# Patient Record
Sex: Male | Born: 1960
Health system: Southern US, Community
[De-identification: ages and names within clinical notes are randomized; demographics above are authoritative.]

## PROBLEM LIST (undated history)

## (undated) DIAGNOSIS — E119 Type 2 diabetes mellitus without complications: Secondary | ICD-10-CM

## (undated) DIAGNOSIS — F419 Anxiety disorder, unspecified: Secondary | ICD-10-CM

## (undated) DIAGNOSIS — F32A Depression, unspecified: Secondary | ICD-10-CM

## (undated) DIAGNOSIS — C642 Malignant neoplasm of left kidney, except renal pelvis: Secondary | ICD-10-CM

## (undated) DIAGNOSIS — Z95810 Presence of automatic (implantable) cardiac defibrillator: Secondary | ICD-10-CM

## (undated) DIAGNOSIS — I509 Heart failure, unspecified: Secondary | ICD-10-CM

## (undated) DIAGNOSIS — I10 Essential (primary) hypertension: Secondary | ICD-10-CM

## (undated) DIAGNOSIS — E785 Hyperlipidemia, unspecified: Secondary | ICD-10-CM

## (undated) DIAGNOSIS — G4730 Sleep apnea, unspecified: Secondary | ICD-10-CM

## (undated) DIAGNOSIS — F329 Major depressive disorder, single episode, unspecified: Secondary | ICD-10-CM

## (undated) HISTORY — DX: Malignant neoplasm of left kidney, except renal pelvis: C64.2

## (undated) HISTORY — DX: Essential (primary) hypertension: I10

## (undated) HISTORY — DX: Depression, unspecified: F32.A

## (undated) HISTORY — DX: Type 2 diabetes mellitus without complications: E11.9

## (undated) HISTORY — PX: IMPLANTABLE CARDIOVERTER DEFIBRILLATOR IMPLANT: SHX5860

## (undated) HISTORY — DX: Hyperlipidemia, unspecified: E78.5

## (undated) HISTORY — PX: NEPHRECTOMY: SHX65

## (undated) HISTORY — DX: Major depressive disorder, single episode, unspecified: F32.9

## (undated) HISTORY — DX: Anxiety disorder, unspecified: F41.9

## (undated) HISTORY — DX: Heart failure, unspecified: I50.9

---

## 2009-03-13 ENCOUNTER — Inpatient Hospital Stay (HOSPITAL_COMMUNITY): Admission: EM | Admit: 2009-03-13 | Discharge: 2009-03-16 | Payer: Self-pay | Admitting: Emergency Medicine

## 2009-03-14 ENCOUNTER — Encounter (INDEPENDENT_AMBULATORY_CARE_PROVIDER_SITE_OTHER): Payer: Self-pay | Admitting: Internal Medicine

## 2010-10-11 ENCOUNTER — Ambulatory Visit
Admission: RE | Admit: 2010-10-11 | Discharge: 2010-10-11 | Disposition: A | Discharge status: Home / Self Care | Payer: BC Managed Care – PPO | Source: Ambulatory Visit | Attending: Endocrinology | Admitting: Endocrinology

## 2010-10-11 ENCOUNTER — Other Ambulatory Visit: Payer: Self-pay | Admitting: Endocrinology

## 2010-10-11 DIAGNOSIS — M545 Low back pain, unspecified: Secondary | ICD-10-CM

## 2010-11-16 LAB — GLUCOSE, CAPILLARY
Glucose-Capillary: 182 mg/dL — ABNORMAL HIGH (ref 70–99)
Glucose-Capillary: 186 mg/dL — ABNORMAL HIGH (ref 70–99)
Glucose-Capillary: 197 mg/dL — ABNORMAL HIGH (ref 70–99)
Glucose-Capillary: 202 mg/dL — ABNORMAL HIGH (ref 70–99)
Glucose-Capillary: 203 mg/dL — ABNORMAL HIGH (ref 70–99)
Glucose-Capillary: 203 mg/dL — ABNORMAL HIGH (ref 70–99)
Glucose-Capillary: 220 mg/dL — ABNORMAL HIGH (ref 70–99)
Glucose-Capillary: 220 mg/dL — ABNORMAL HIGH (ref 70–99)
Glucose-Capillary: 232 mg/dL — ABNORMAL HIGH (ref 70–99)
Glucose-Capillary: 246 mg/dL — ABNORMAL HIGH (ref 70–99)

## 2010-11-16 LAB — CBC
HCT: 41.4 % (ref 39.0–52.0)
HCT: 45.6 % (ref 39.0–52.0)
Hemoglobin: 15.6 g/dL (ref 13.0–17.0)
MCHC: 34.2 g/dL (ref 30.0–36.0)
MCHC: 34.6 g/dL (ref 30.0–36.0)
MCV: 92.1 fL (ref 78.0–100.0)
MCV: 92.5 fL (ref 78.0–100.0)
Platelets: 180 10*3/uL (ref 150–400)
Platelets: 190 10*3/uL (ref 150–400)
Platelets: 198 10*3/uL (ref 150–400)
RBC: 4.51 MIL/uL (ref 4.22–5.81)
RBC: 4.65 MIL/uL (ref 4.22–5.81)
RBC: 4.93 MIL/uL (ref 4.22–5.81)
RDW: 12.8 % (ref 11.5–15.5)
RDW: 13.3 % (ref 11.5–15.5)
WBC: 6.8 10*3/uL (ref 4.0–10.5)
WBC: 6.9 10*3/uL (ref 4.0–10.5)
WBC: 7.4 10*3/uL (ref 4.0–10.5)

## 2010-11-16 LAB — POCT I-STAT 3, ART BLOOD GAS (G3+)
Acid-Base Excess: 1 mmol/L (ref 0.0–2.0)
O2 Saturation: 96 %
TCO2: 27 mmol/L (ref 0–100)
pCO2 arterial: 39.6 mmHg (ref 35.0–45.0)
pO2, Arterial: 80 mmHg (ref 80.0–100.0)

## 2010-11-16 LAB — BASIC METABOLIC PANEL
BUN: 12 mg/dL (ref 6–23)
BUN: 13 mg/dL (ref 6–23)
Calcium: 9.1 mg/dL (ref 8.4–10.5)
Chloride: 107 mEq/L (ref 96–112)
Creatinine, Ser: 0.92 mg/dL (ref 0.4–1.5)
Creatinine, Ser: 0.99 mg/dL (ref 0.4–1.5)
GFR calc Af Amer: >60 mL/min (ref >60)
GFR calc Af Amer: >60 mL/min (ref >60)
GFR calc non Af Amer: >60 mL/min (ref >60)
Glucose, Bld: 163 mg/dL — ABNORMAL HIGH (ref 70–99)
Potassium: 3.2 mEq/L — ABNORMAL LOW (ref 3.5–5.1)
Potassium: 3.7 mEq/L (ref 3.5–5.1)
Sodium: 140 mEq/L (ref 135–145)

## 2010-11-16 LAB — COMPREHENSIVE METABOLIC PANEL
ALT: 26 U/L (ref 0–53)
AST: 26 U/L (ref 0–37)
Calcium: 8.8 mg/dL (ref 8.4–10.5)
Creatinine, Ser: 0.88 mg/dL (ref 0.4–1.5)
GFR calc Af Amer: >60 mL/min (ref >60)
Sodium: 139 mEq/L (ref 135–145)
Total Protein: 6.3 g/dL (ref 6.0–8.3)

## 2010-11-16 LAB — DIFFERENTIAL
Basophils Absolute: 0.1 K/uL (ref 0.0–0.1)
Basophils Relative: 1 % (ref 0–1)
Eosinophils Absolute: 0.1 10*3/uL (ref 0.0–0.7)
Eosinophils Relative: 2 % (ref 0–5)
Lymphocytes Relative: 23 % (ref 12–46)
Lymphs Abs: 1.7 10*3/uL (ref 0.7–4.0)
Monocytes Absolute: 0.6 10*3/uL (ref 0.1–1.0)
Monocytes Relative: 8 % (ref 3–12)
Neutro Abs: 5 K/uL (ref 1.7–7.7)
Neutrophils Relative %: 67 % (ref 43–77)

## 2010-11-16 LAB — BASIC METABOLIC PANEL WITH GFR
CO2: 21 meq/L (ref 19–32)
Calcium: 9.1 mg/dL (ref 8.4–10.5)
Creatinine, Ser: 0.73 mg/dL (ref 0.4–1.5)
GFR calc Af Amer: >60 mL/min (ref >60)
GFR calc non Af Amer: >60 mL/min (ref >60)
Sodium: 138 meq/L (ref 135–145)

## 2010-11-16 LAB — POCT CARDIAC MARKERS
CKMB, poc: <1 ng/mL — ABNORMAL LOW (ref 1.0–8.0)
Myoglobin, poc: 60.5 ng/mL (ref 12–200)
Myoglobin, poc: 64.2 ng/mL (ref 12–200)
Troponin i, poc: <0.05 ng/mL (ref 0.00–0.09)

## 2010-11-16 LAB — POCT I-STAT 3, VENOUS BLOOD GAS (G3P V)
Bicarbonate: 26 mEq/L — ABNORMAL HIGH (ref 20.0–24.0)
O2 Saturation: 61 %
pCO2, Ven: 46.5 mmHg (ref 45.0–50.0)
pO2, Ven: 34 mmHg (ref 30.0–45.0)

## 2010-11-16 LAB — CARDIAC PANEL(CRET KIN+CKTOT+MB+TROPI)
Relative Index: INVALID (ref 0.0–2.5)
Relative Index: INVALID (ref 0.0–2.5)
Total CK: 72 U/L (ref 7–232)
Total CK: 86 U/L (ref 7–232)
Troponin I: 0.04 ng/mL (ref 0.00–0.06)

## 2010-11-16 LAB — HEPARIN LEVEL (UNFRACTIONATED): Heparin Unfractionated: 0.19 IU/mL — ABNORMAL LOW (ref 0.30–0.70)

## 2010-11-16 LAB — TROPONIN I: Troponin I: 0.05 ng/mL (ref 0.00–0.06)

## 2010-11-16 LAB — CK TOTAL AND CKMB (NOT AT ARMC): Relative Index: INVALID (ref 0.0–2.5)

## 2010-11-16 LAB — PROTIME-INR
INR: 1 (ref 0.00–1.49)
Prothrombin Time: 13.2 seconds (ref 11.6–15.2)

## 2010-11-16 LAB — APTT: aPTT: 25 s (ref 24–37)

## 2010-11-29 ENCOUNTER — Other Ambulatory Visit: Payer: Self-pay | Admitting: Urology

## 2010-11-29 ENCOUNTER — Ambulatory Visit (HOSPITAL_BASED_OUTPATIENT_CLINIC_OR_DEPARTMENT_OTHER)
Admission: RE | Admit: 2010-11-29 | Discharge: 2010-11-29 | Disposition: A | Discharge status: Home / Self Care | Payer: BC Managed Care – PPO | Source: Ambulatory Visit | Attending: Urology | Admitting: Urology

## 2010-11-29 DIAGNOSIS — Z01810 Encounter for preprocedural cardiovascular examination: Secondary | ICD-10-CM | POA: Insufficient documentation

## 2010-11-29 DIAGNOSIS — Z7982 Long term (current) use of aspirin: Secondary | ICD-10-CM | POA: Insufficient documentation

## 2010-11-29 DIAGNOSIS — Z794 Long term (current) use of insulin: Secondary | ICD-10-CM | POA: Insufficient documentation

## 2010-11-29 DIAGNOSIS — R310 Gross hematuria: Secondary | ICD-10-CM | POA: Insufficient documentation

## 2010-11-29 DIAGNOSIS — Z01812 Encounter for preprocedural laboratory examination: Secondary | ICD-10-CM | POA: Insufficient documentation

## 2010-11-29 DIAGNOSIS — N329 Bladder disorder, unspecified: Secondary | ICD-10-CM | POA: Insufficient documentation

## 2010-11-29 DIAGNOSIS — Z79899 Other long term (current) drug therapy: Secondary | ICD-10-CM | POA: Insufficient documentation

## 2010-11-29 DIAGNOSIS — R9431 Abnormal electrocardiogram [ECG] [EKG]: Secondary | ICD-10-CM | POA: Insufficient documentation

## 2010-11-29 LAB — POCT I-STAT 4, (NA,K, GLUC, HGB,HCT)
Potassium: 4.5 mEq/L (ref 3.5–5.1)
Sodium: 139 mEq/L (ref 135–145)

## 2010-12-04 ENCOUNTER — Inpatient Hospital Stay (HOSPITAL_COMMUNITY): Payer: BC Managed Care – PPO

## 2010-12-04 ENCOUNTER — Inpatient Hospital Stay (HOSPITAL_COMMUNITY)
Admission: EM | Admit: 2010-12-04 | Discharge: 2010-12-07 | DRG: 326 | Disposition: A | Discharge status: Home / Self Care | Payer: BC Managed Care – PPO | Attending: Urology | Admitting: Urology

## 2010-12-04 DIAGNOSIS — R310 Gross hematuria: Principal | ICD-10-CM | POA: Diagnosis present

## 2010-12-04 DIAGNOSIS — I10 Essential (primary) hypertension: Secondary | ICD-10-CM | POA: Diagnosis present

## 2010-12-04 DIAGNOSIS — Z794 Long term (current) use of insulin: Secondary | ICD-10-CM

## 2010-12-04 DIAGNOSIS — Z9889 Other specified postprocedural states: Secondary | ICD-10-CM

## 2010-12-04 DIAGNOSIS — E119 Type 2 diabetes mellitus without complications: Secondary | ICD-10-CM | POA: Diagnosis present

## 2010-12-04 DIAGNOSIS — R109 Unspecified abdominal pain: Secondary | ICD-10-CM | POA: Diagnosis present

## 2010-12-04 DIAGNOSIS — E7800 Pure hypercholesterolemia, unspecified: Secondary | ICD-10-CM | POA: Diagnosis present

## 2010-12-04 LAB — GLUCOSE, CAPILLARY
Glucose-Capillary: 185 mg/dL — ABNORMAL HIGH (ref 70–99)
Glucose-Capillary: 197 mg/dL — ABNORMAL HIGH (ref 70–99)

## 2010-12-04 MED ORDER — GADOBENATE DIMEGLUMINE 529 MG/ML IV SOLN
20.0000 mL | Freq: Once | INTRAVENOUS | Status: AC | PRN
  Administered 2010-12-04: 20 mL via INTRAVENOUS

## 2010-12-05 LAB — GLUCOSE, CAPILLARY
Glucose-Capillary: 206 mg/dL — ABNORMAL HIGH (ref 70–99)
Glucose-Capillary: 173 mg/dL — ABNORMAL HIGH (ref 70–99)
Glucose-Capillary: 198 mg/dL — ABNORMAL HIGH (ref 70–99)
Glucose-Capillary: 190 mg/dL — ABNORMAL HIGH (ref 70–99)

## 2010-12-06 LAB — GLUCOSE, CAPILLARY
Glucose-Capillary: 196 mg/dL — ABNORMAL HIGH (ref 70–99)
Glucose-Capillary: 193 mg/dL — ABNORMAL HIGH (ref 70–99)

## 2010-12-06 LAB — BASIC METABOLIC PANEL
BUN: 15 mg/dL (ref 6–23)
CO2: 27 mEq/L (ref 19–32)
Chloride: 100 mEq/L (ref 96–112)
Creatinine, Ser: 1.5 mg/dL (ref 0.4–1.5)

## 2010-12-06 LAB — MRSA PCR SCREENING: MRSA by PCR: NEGATIVE

## 2010-12-06 LAB — HEMOGLOBIN A1C: Hgb A1c MFr Bld: 7.7 % — ABNORMAL HIGH (ref <5.7)

## 2010-12-06 LAB — CBC
MCH: 29.8 pg (ref 26.0–34.0)
MCV: 88.6 fL (ref 78.0–100.0)
Platelets: 218 10*3/uL (ref 150–400)
RBC: 4.13 MIL/uL — ABNORMAL LOW (ref 4.22–5.81)

## 2010-12-07 NOTE — Discharge Summary (Signed)
Donald Moore, Donald Moore              ACCOUNT NO.:  [id number]  MEDICAL RECORD NO.:  [id number]           PATIENT TYPE:  I  LOCATION:  1442                         FACILITY:  [facility]  PHYSICIAN:  Danae Chen, M.D.  DATE OF BIRTH:  1961-04-16  DATE OF ADMISSION:  12/04/2010 DATE OF DISCHARGE:  12/07/2010                              DISCHARGE SUMMARY   DISCHARGE DIAGNOSES: 1. Gross hematuria. 2. Diabetes.  PROCEDURES:  Cystoscopy, left retrograde pyelogram and insertion of double-J stent on December 05, 2010.  HISTORY:  The patient is a 50 year old male who started having severe left flank pain on December 03, 2010 associated with nausea and vomiting. He has been having gross hematuria on and off for about 6 to 7 weeks.  A CT scan without contrast showed unremarkable kidneys.  He was then referred to me for further evaluation.  He had a cystoscopy and biopsy of a bladder lesion under anesthesia on November 29, 2010.  Biopsy of the lesion is benign.  He went to the emergency room at [facility] with severe left flank pain, nausea, vomiting and gross hematuria. A Foley catheter was then inserted in the bladder.  A CT without contrast showed probable hemorrhage in the left kidney and very small amount of air outside of the bladder on the right side that is probably secondary to his bladder biopsy.  There was no evidence of fluid collection in the pelvis.  He was then transferred to [facility] for further evaluation and treatment.  PHYSICAL EXAMINATION:  HEENT:  His head was normal. CHEST:  Symmetrical. LUNGS: Clear. HEART: Regular rhythm. ABDOMEN:  Protuberant, tender in the left flank.  He had severe left CVA tenderness.  Kidneys are not palpable.  There was no tenderness in the suprapubic area.  Bowel sounds were normal.  LABORATORY DATA:  His creatinine was 1.13.  Glucose was elevated at 252. CBC was normal.  HOSPITAL COURSE:  An MRI of the kidneys showed normal right kidney  and no solid tumor of the left kidney.  A urothelial tumor could not be ruled out because of blood clots in the collecting system.  The patient underwent cystoscopy and attempted ureteroscopy of the left renal unit. However, the ureteroscope could not be advanced through the ureter.  A retrograde pyelogram showed no evidence of filling defect in the ureter nor in the renal pelvis or the calyces.  Since the ureteroscopy could not be done, a double-J stent was left in place.  Since double-J stent insertion, the patient has been doing much better.  He has less pain. The Foley catheter was draining slightly bloody urine and the urine gradually cleared up.  Foley catheter was removed this morning and he has been voiding on his own.  Urine is dark colored.  There was no evidence of gross bleeding at this time.  He does not have any more pain.  He is tolerating his diet well.  He does not have any tenderness in the flank nor in the suprapubic area.  His hemoglobin A1c was 7.7. He was then discharged home on April 28 on all his home medications  and Macrodantin 50 mg daily h.s. and Vicodin 5/325 one tablet every 4 hours as needed for pain.  The patient will be followed as an outpatient.  He will have repeat ureteroscopy when the urine is completely clear.  Hopefully with the stent in place, will have passive dilation of the ureter and will be easier to pass the ureteroscope up into the renal pelvis.  CONDITION ON DISCHARGE:  Improved.  DISCHARGE DIET:  1800-calorie diabetic diet.  ACTIVITY:  The patient is instructed not to do any lifting, straining until further advised.     Danae Chen, M.D.     MN/MEDQ  D:  12/07/2010  T:  12/07/2010  Job:  [id number]  cc:   Brooke Bonito, M.D. Fax: [phone]  Electronically Signed by Lindaann Slough M.D. on 12/07/2010 11:05:41 AM

## 2010-12-07 NOTE — Op Note (Signed)
Donald Moore, Donald Moore              ACCOUNT NO.:  [id number]  MEDICAL RECORD NO.:  [id number]           PATIENT TYPE:  I  LOCATION:  1442                         FACILITY:  [facility]  PHYSICIAN:  Danae Chen, M.D.  DATE OF BIRTH:  02-05-1961  DATE OF PROCEDURE:  12/05/2010 DATE OF DISCHARGE:                              OPERATIVE REPORT   PREOPERATIVE DIAGNOSES:  Gross hematuria, left flank pain.  POSTOPERATIVE DIAGNOSIS:  Gross hematuria, left flank pain.  PROCEDURES: 1. Cystoscopy. 2. Left retrograde pyelogram. 3. Attempted left ureteroscopy. 4. Insertion of double-J stent.  SURGEON:  Danae Chen, M.D.  ANESTHESIA:  General.  INDICATIONS:  The patient is a 50 year old male who has a history of gross hematuria on and off for the past 6-7 weeks.  Cystoscopy on November 29, 2010, showed a small lesion on the right side of the bladder.  A biopsy of the bladder lesion was done.  The patient was doing well until Tuesday night when he had sudden onset of severe left flank pain associated with gross hematuria.  CT scan showed possible  hemorrhage in the left kidney with several blood clots in the left collecting system. He also had a small amount of air on the right side of the bladder that is probably secondary to his bladder biopsy.  He continued to have flank pain.  A Foley catheter was inserted in the bladder and it drained grossly bloody urine at first and then urine cleared up.  MRI of the kidneys showed no solid tumor of the kidney and blood clots in the renal collecting system that could  mask a urothelial tumor.  Since the patient had continued to complain of pain, we discussed cystoscopy with retrograde pyelogram and ureteroscopy.  I explained to him and his wife that the because of the bleeding, it may be difficult to see the mucosa of the renal pelvis and collecting systems and if that is not possible we would then put a stent in the left kidney to see if that would relieve  the pain and if there is not good visualization of the renal collecting system, he would then need repeat ureteroscopy when the bleeding stops.  The risks and benefits of the procedure were discussed in detail.  The risks include but are not limited to bleeding, infection, ureteral injury and inability to visualize properly the renal collecting system.  They understand and are agreeable and they gave informed consent.  The patient was identified by his wristband and proper time-out was taken.  DESCRIPTION OF PROCEDURE:  Under general anesthesia, he was prepped and draped and placed in the dorsal lithotomy position.  A panendoscope was inserted in the bladder.  The area of biopsy of the right lateral wall of the bladder above the ureteral orifice showed no evidence of hemorrhage.  The right ureteral orifice was normal.  There was bloody efflux from the left ureteral orifice.  There was no stone or tumor in the bladder.  Retrograde pyelogram.  A cone-tip catheter was passed through the cystoscope and through the left ureteral orifice.  Contrast was then injected through the cone-tip catheter.  The ureter appeared normal. The renal pelvis is intrarenal and is not dilated.  There is no evidence of filling defect in the collecting system.  The cone-tip catheter was then removed.  A sensor wire was passed through the cystoscope and through the left ureter.  The cystoscope was removed.  A ureteroscope access sheath for the digital ureteroscope was passed over the guidewire but could not be advanced beyond the distal ureter.  There appeared to be some obstruction to the passage of the ureteroscope access sheath. The ureteroscope access sheath was removed.  The inner sheath of a ureteroscope access sheath was passed over the guidewire but could not be passed beyond the distal ureter.  The ureteroscope access sheath was removed.  Then the ureteroscope access sheath for the digital ureteroscope  was then passed over the wire into the distal ureter.  The flexible ureteroscope was then passed over the sensor wire.  There was hemorrhage in the ureter making difficult to visualize the lumen of the ureter and the ureteroscope could not be advanced over the sensor wire. At this point, I decided to remove the ureteroscope and the ureteroscope access sheath and to place a double-J stent over the guide wire.  Then the guidewire was backloaded into the cystoscope and a #6-French - 26 double-J stent was passed over the guidewire.  The proximal curl of the double-J stent is in the collecting system.  The distal curl is in the bladder.  The guidewire was then removed.  The bladder was irrigated with normal saline and the return was then pinkish.  The cystoscope was then removed.  A #16-French Foley catheter was then reinserted in the bladder.  The patient tolerated the procedure well and left the OR in satisfactory condition to postanesthesia care unit.     Danae Chen, M.D.     MN/MEDQ  D:  12/05/2010  T:  12/06/2010  Job:  [id number]  cc:   Brooke Bonito, M.D. Fax: [phone]  Electronically Signed by Lindaann Slough M.D. on 12/07/2010 11:03:36 AM

## 2010-12-07 NOTE — Op Note (Signed)
  [name], Donald Moore              ACCOUNT NO.:  [id number]  MEDICAL RECORD NO.:  [id number]            PATIENT TYPE:  LOCATION:                                 FACILITY:  PHYSICIAN:  Danae Chen, M.D.       DATE OF BIRTH:  DATE OF PROCEDURE:  11/29/2010 DATE OF DISCHARGE:                              OPERATIVE REPORT   PREOPERATIVE DIAGNOSIS:  Gross hematuria.  POSTOPERATIVE DIAGNOSIS:  Gross hematuria, plus papillary lesion of the bladder above the right ureteral orifice.  PROCEDURES DONE:  Cystoscopy and biopsy of bladder lesion.  SURGEON:  Danae Chen, M.D.  ANESTHESIA:  General.  INDICATIONS:  The patient is a 50 year old male who has a history of gross painless hematuria for 6 to 7 weeks.  CT scan showed normal urinary tracts.  The patient was advised to have a cystoscopy to complete the evaluation.  However, he preferred to have it done under general anesthesia.  He is scheduled today for the procedure.  The patient was identified by his wristband and proper time-out was taken.  DESCRIPTION OF PROCEDURE:  Under general anesthesia, he was prepped and draped and placed in the dorsal lithotomy position.  A panendoscope was inserted in the bladder.  The anterior urethra was normal.  He had moderate prostatic hypertrophy.  There was a small papillary lesion on the right side of the bladder above the right ureteral orifice.   It did notappear to be transitional cell carcinoma.  There was no other lesion in the bladder.  There was no stone.  The ureteral orifices were in normal position and shape with clear efflux.  A cold cup biopsy of the papillary lesion was done.  The area of biopsy was then fulgurated with the Bugbee electrode.  The bladder was examined with both the right-angle and Foroblique lenses.  There was no evidence of bleeding at the end of the procedure.  The bladder was then irrigated with normal saline and washings were sent for cytology.  The bladder  was then emptied and the cystoscope was removed.  The patient tolerated the procedure well and left the OR in satisfactory condition to postanesthesia care unit.     Danae Chen, M.D.     MN/MEDQ  D:  11/29/2010  T:  11/29/2010  Job:  [id number]  Electronically Signed by Lindaann Slough M.D. on 12/07/2010 11:00:45 AM

## 2010-12-10 NOTE — H&P (Signed)
Donald Moore, Donald Moore              ACCOUNT NO.:  [id number]  MEDICAL RECORD NO.:  [id number]           PATIENT TYPE:  E  LOCATION:  WLED                         FACILITY:  [facility]  PHYSICIAN:  Bertram Millard. Deardra Hinkley, M.D.DATE OF BIRTH:  [birth year]  DATE OF ADMISSION:  12/04/2010 DATE OF DISCHARGE:                             HISTORY & PHYSICAL   REASON FOR ADMISSION:  Gross hematuria, flank pain.  BRIEF HISTORY:  This 50 year old male began having significant left flank pain about 11 o'clock p.m. on December 03, 2010.  This was associated with nausea and vomiting.  It was noted to be a "10 out of 10."  The patient has had intermittent gross hematuria for some time, for 6 to 7 weeks.  He was initially seen by Dr. Adela Lank and underwent a noncontrasted CT which revealed normal findings.  He was subsequently referred to Dr. Su Grand for further evaluation.  This included an NMP22 which was negative from November 26, 2010.  He underwent cystoscopy under anesthesia on November 29, 2010.  This revealed a small lesion around the patient's right ureteral orifice which was biopsied.  No other abnormalities were seen.  The patient subsequently went home after this.  Pathology biopsy was negative.  He presented to the emergency room at [facility] early this morning with severe flank pain, nausea, and vomiting.  He was found to have hematuria.  Catheter was placed.  CT urogram was performed revealing probable hemorrhage in the left kidney and a left renal collecting system and a very small amount of air outside the bladder on the right side.  He had a catheter placed with blood was produced.  He was subsequently transferred to [facility] for further management.  PAST MEDICAL HISTORY:  His past medical history is significant for cardiomyopathy, most likely viral induced.  This has been followed at [facility].  Currently, the patient apparently has an ejection  fraction of about 30%.  He has a history of sleep apnea, hypercholesterolemia, hypertension, and diabetes.  CURRENT MEDICATIONS:  Include aspirin which has been on hold, Lantus insulin 5 units subcu after dinner, Crestor 10 mg in the evening, ramipril 5 mg b.i.d., metformin 500 mg b.i.d., citalopram 20 mg daily, carvedilol 6.25 mg 1/1/2 tabs q.12 h.  ALLERGIES:  He denies drug allergies.  The patient is married.  He quit cigarette smoking 16 years ago.  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  FAMILY HISTORY:  Significant for TIA, hypertension, heart disease.  REVIEW OF SYSTEMS:  Negative across all 12 systems except for nausea, vomiting, left flank pain and gross hematuria.  PHYSICAL EXAMINATION:  GENERAL:  Exam revealed a pleasant but uncomfortable middle-aged male. HEENT:  Normal. NECK:  Supple without thyromegaly or adenopathy. CHEST:  Reveal clear breath sounds bilaterally. HEART:  Normal rate and rhythm. ABDOMEN:  Protuberant, soft, nondistended with moderate left lower quadrant and left CVA tenderness.  Bowel sounds are present.  Genitalia were normal.  He had trace pretibial edema bilaterally.  Dorsalis pedis and posterior tibial pulses were normal bilaterally. SKIN:  Warm and dry, turgor was normal. NEUROLOGIC EXAM:  Grossly intact.  RADIOLOGIC DATA/LABORATORY DATA:  I was unable to view the images of a CT, but the patient was noted to have the previously mentioned findings.  Admission data included a CBC that was normal.  Basic metabolic panel was normal except for glucose of 252.  Creatinine was 1.13.  Calcium was normal.  Urinalysis revealed too numerous to count red cells, 10 to 20 white cells, moderate mucus and been very few bacteria.  IMPRESSION: 1. Gross hematuria.  This is likely secondary to a left renal lesion     within the collecting system, such as urothelial carcinoma or less     likely a vascular malformation.  Currently, the patient is having     significant  pain, although the urine coming out of the catheter at     this point is clear. 2. Status post recent bladder biopsy.  He has a very small amount of     air outside his bladder on the CT scan, I think this is probably     inconsequential  PLAN: 1. I will admit the patient to Dr. Madilyn Hook service. 2. I will put him on a PCA and he will be hydrated.  Glucoses will be     followed carefully. 3. Likely, in the near future, he will require anesthetic cystoscopy,     left ureteroscopy.     Bertram Millard. Rhylei Mcquaig, M.D.     SMD/MEDQ  D:  12/04/2010  T:  12/04/2010  Job:  [id number]  cc:   Lindaann Slough, M.D. Fax: [phone]  Brooke Bonito, M.D. Fax: [phone]  Electronically Signed by Donald Moore M.D. on 12/10/2010 12:02:55 PM

## 2010-12-24 NOTE — Discharge Summary (Signed)
Donald Moore, Donald Moore              ACCOUNT NO.:  [id number]   MEDICAL RECORD NO.:  [id number]          PATIENT TYPE:  INP   LOCATION:  3715                         FACILITY:  [facility]   PHYSICIAN:  Theodosia Paling, MD    DATE OF BIRTH:  Jun 12, 1961   DATE OF ADMISSION:  03/13/2009  DATE OF DISCHARGE:  03/16/2009                               DISCHARGE SUMMARY   PRIMARY CARE PHYSICIAN:  Brooke Bonito, MD   ADMITTING HISTORY:  Please refer to my admission note dictated on March 13, 2009, under history of present illness.   DISCHARGE DIAGNOSES:  1. Systolic congestive heart failure, newly diagnosed in this      admission with ejection fraction of 20% with global hypokinesia      compensated at the time of discharge.  2. History of diabetes.  3. Hypertension.  4. Hyperlipidemia.   DISCHARGE MEDICATIONS:  New medications added:  1. Coreg 6.25 mg p.o. q.12 h.  2. Lantus 20 units subcu at bedtime.  3. Aldactone 25 mg p.o. daily.   Home medications to continue:  1. Altace 5 mg p.o. daily.  2. Aspirin 81 mg enteric-coated p.o. daily.  3. Crestor 10 mg p.o. at bedtime.  4. Byetta 10 units subcu at bedtime.  5. Medication on hold for a week and then can be resumed   HOSPITAL COURSE:  Following issues were addressed during the  hospitalization.  1. Chest pain.  The patient was admitted to the hospital for chest      pain.  He had some EKG changes and significant coronary artery      disease risk, therefore, cardiac cath was performed by Dr. Algie Coffer.      Cardiology consult was done by him on March 13, 2009.  Cardiac cath      was negative for coronary artery disease.  However, the patient had      global hypokinesia with EF of 20%.  Cardioprotective medications      were continued.  I added Aldactone and Coreg to his regimen.  The      patient is compensated at the time of discharge.  He will be      following up with Dr. Algie Coffer.  He was hemodynamically stable and      tolerated newly  added medications well.  2.  Hypertension.  Well      controlled on home medication.  He tolerated Coreg and Aldactone      addition as well.  2. Diabetes.  The patient's blood sugar is still high with 8 units of      Lantus.  We are increasing it to 12 units of Lantus.  He will      resume his metformin after 1 week and follow up with the primary      care physician for further diabetes management.   CONSULTATION PERFORMED AND PROCEDURE PERFORMED:  As mentioned under  hospital course.   IMAGING PERFORMED:  Chest x-ray on March 13, 2009, showing mild volume  loss and atelectasis in the right lung base.  No airspace disease.   DISPOSITION:  1. The patient will follow up with Dr. Algie Coffer in his office for      cardiac followup.  2. The patient will follow up with Dr. Juleen China in his office in 1 week      time for repeat basic metabolic panel given a start of Aldactone      and for hyperkalemia with ACE inhibitor and aldosterone antagonist      on board and also for diabetes management.   Total time spent in discharge 45 minutes.      Theodosia Paling, MD  Electronically Signed     NP/MEDQ  D:  03/16/2009  T:  03/17/2009  Job:  [id number]   cc:   Brooke Bonito, M.D.  Ricki Rodriguez, M.D.

## 2010-12-24 NOTE — H&P (Signed)
NAMEABBIE, JABLON              ACCOUNT NO.:  [id number]   MEDICAL RECORD NO.:  [id number]          PATIENT TYPE:  EMS   LOCATION:  MAJO                         FACILITY:  [facility]   PHYSICIAN:  Theodosia Paling, MD    DATE OF BIRTH:  [birth year]   DATE OF ADMISSION:  03/13/2009  DATE OF DISCHARGE:                              HISTORY & PHYSICAL   PRIMARY CARE PHYSICIAN:  Dr. Juleen China   CHIEF COMPLAINT:  Chest pain.   HISTORY OF PRESENT ILLNESS:  Mr. Weiand is a very pleasant, 50 year old  gentleman with a history of hypertension, diabetes mellitus, and  hyperlipidemia as well as obesity who was in his usual state of health  around 1 week back when he started experiencing chest pain which started  as intermittent rest pain in retrosternal location on the scale of 6 to  7/10 radiating to his right arm, no radiation to his neck, with pressure-  like sensation.  Associated features included sweating and shortness of  breath, and a little bit of nausea.  He reported to the emergency room  for further evaluation and management where Nitroglycerin relieved his  symptoms.  At this time, he is currently chest pain-free.  He has a  family history of heart disease.  [facility] was  contacted for further evaluation and management.   REVIEW OF SYSTEMS:  As per HPI, otherwise negative.   PAST MEDICAL HISTORY:  1. Diabetes.  2. Hypertension.  3. Hyperlipidemia.   SOCIAL HISTORY:  He has a history of tobacco abuse, 2 packs per day for  20 years.  However, he has quit smoking for the last 15 years.   ALLERGIES:  No known drug allergies.   PAST SURGICAL HISTORY:  None.   FAMILY HISTORY:  The patient's maternal grandfather had a heart attack  in his 78s.  Maternal grandmother had heart attack in 104s.  The  patient's father had a stent placed in his 66s and brother had a stroke  in his 21s.   EXAMINATION:  VITAL SIGNS:  Temperature 97.3, blood pressure 130/80,  heart rate 85,  respiratory rate 16, oxygen saturation 96% at room air.  GENERAL:  No acute cardiorespiratory distress.  HEENT:  EOM intact.  No ear or nose discharge.  LUNGS:  Normal breath sounds.  No rhonchi or crepitations.  CARDIOVASCULAR:  S1 and S2 normal.  No murmur or gallop.  GI:  Soft, nontender.  No organomegaly.  EXTREMITIES:  No pedal edema.  PSYCH:  Oriented x3.  CNS:  Speech intact.  Follows commands.   LAB DATA:  WBC 7.4, hemoglobin 15.6, hematocrit 45.6, platelet count  198,000.  Sodium 138, potassium 3.7, chloride 107, bicarbonate 21, BUN  12, creatinine 0.73, glucose 163.  Cardiac enzymes, first set negative.  EKG:  Poor R-wave progression, overall low voltage QRS, P wave, mild  inversions in II, III, aVF.   ASSESSMENT AND PLAN:  1. Chest pain.  The patient has several risk factors.  It appears to      be that the patient is at very high risk of coronary artery  disease.  What he must be experiencing is unstable angina.  I      started the patient on aspirin, statin, beta blocker, and the      patient is already on ACE inhibitor.  We will continue all of      above.  Start the patient on Plavix and heparin.  Discussed the      patient with Dr. Algie Coffer and the patient will proceed with cardiac      cath.  I will keep him n.p.o. overnight, continue to cycle enzymes,      and put him on telemetry.  2. Hypertension.  Continue with Coreg and Altace.  3. Diabetes.  Sensitive sliding scale insulin ordered.  HB1C      requested.  4. Hyperlipidemia.  Continue Crestor.  5. Prophylaxis.  DVT/GI requested.  6. Code status.  The patient is full code.   TOTAL TIME SPENT ON ADMISSION:  An hour.      Theodosia Paling, MD  Electronically Signed     NP/MEDQ  D:  03/13/2009  T:  03/13/2009  Job:  [id number]   cc:   Brooke Bonito, M.D.

## 2011-01-03 ENCOUNTER — Other Ambulatory Visit: Payer: Self-pay | Admitting: Urology

## 2011-01-03 ENCOUNTER — Ambulatory Visit (HOSPITAL_BASED_OUTPATIENT_CLINIC_OR_DEPARTMENT_OTHER)
Admission: RE | Admit: 2011-01-03 | Discharge: 2011-01-03 | Disposition: A | Discharge status: Home / Self Care | Payer: BC Managed Care – PPO | Source: Ambulatory Visit | Attending: Urology | Admitting: Urology

## 2011-01-03 DIAGNOSIS — Z01812 Encounter for preprocedural laboratory examination: Secondary | ICD-10-CM | POA: Insufficient documentation

## 2011-01-03 DIAGNOSIS — N059 Unspecified nephritic syndrome with unspecified morphologic changes: Secondary | ICD-10-CM | POA: Insufficient documentation

## 2011-01-03 DIAGNOSIS — R310 Gross hematuria: Secondary | ICD-10-CM | POA: Insufficient documentation

## 2011-01-03 LAB — POCT I-STAT 4, (NA,K, GLUC, HGB,HCT)
Glucose, Bld: 302 mg/dL — ABNORMAL HIGH (ref 70–99)
Hemoglobin: 13.3 g/dL (ref 13.0–17.0)
Potassium: 4.4 mEq/L (ref 3.5–5.1)

## 2011-01-03 LAB — GLUCOSE, CAPILLARY: Glucose-Capillary: 227 mg/dL — ABNORMAL HIGH (ref 70–99)

## 2011-01-15 ENCOUNTER — Other Ambulatory Visit (HOSPITAL_COMMUNITY): Payer: Self-pay | Admitting: Urology

## 2011-01-15 DIAGNOSIS — R310 Gross hematuria: Secondary | ICD-10-CM

## 2011-01-20 ENCOUNTER — Ambulatory Visit (HOSPITAL_COMMUNITY)
Admission: RE | Admit: 2011-01-20 | Discharge: 2011-01-20 | Disposition: A | Discharge status: Home / Self Care | Payer: BC Managed Care – PPO | Source: Ambulatory Visit | Attending: Urology | Admitting: Urology

## 2011-01-20 DIAGNOSIS — I700 Atherosclerosis of aorta: Secondary | ICD-10-CM | POA: Insufficient documentation

## 2011-01-20 DIAGNOSIS — R310 Gross hematuria: Secondary | ICD-10-CM | POA: Insufficient documentation

## 2011-01-20 MED ORDER — IOHEXOL 350 MG/ML SOLN
125.0000 mL | Freq: Once | INTRAVENOUS | Status: AC | PRN
  Administered 2011-01-20: 125 mL via INTRAVENOUS

## 2011-01-28 ENCOUNTER — Other Ambulatory Visit: Payer: Self-pay | Admitting: Urology

## 2011-01-28 ENCOUNTER — Other Ambulatory Visit (HOSPITAL_COMMUNITY): Payer: Self-pay | Admitting: Urology

## 2011-01-28 ENCOUNTER — Encounter (HOSPITAL_COMMUNITY): Payer: BC Managed Care – PPO

## 2011-01-28 ENCOUNTER — Ambulatory Visit (HOSPITAL_COMMUNITY)
Admission: RE | Admit: 2011-01-28 | Discharge: 2011-01-28 | Disposition: A | Discharge status: Home / Self Care | Payer: BC Managed Care – PPO | Source: Ambulatory Visit | Attending: Urology | Admitting: Urology

## 2011-01-28 DIAGNOSIS — Z01818 Encounter for other preprocedural examination: Secondary | ICD-10-CM

## 2011-01-28 DIAGNOSIS — R310 Gross hematuria: Secondary | ICD-10-CM | POA: Insufficient documentation

## 2011-01-28 DIAGNOSIS — Z01812 Encounter for preprocedural laboratory examination: Secondary | ICD-10-CM | POA: Insufficient documentation

## 2011-01-28 DIAGNOSIS — I509 Heart failure, unspecified: Secondary | ICD-10-CM | POA: Insufficient documentation

## 2011-01-28 LAB — BASIC METABOLIC PANEL
BUN: 21 mg/dL (ref 6–23)
CO2: 27 mEq/L (ref 19–32)
Calcium: 10.4 mg/dL (ref 8.4–10.5)
Chloride: 95 mEq/L — ABNORMAL LOW (ref 96–112)
Creatinine, Ser: 0.96 mg/dL (ref 0.50–1.35)
GFR calc Af Amer: >60 mL/min (ref >60)
GFR calc non Af Amer: >60 mL/min (ref >60)
Glucose, Bld: 303 mg/dL — ABNORMAL HIGH (ref 70–99)
Potassium: 4.6 mEq/L (ref 3.5–5.1)
Sodium: 133 mEq/L — ABNORMAL LOW (ref 135–145)

## 2011-01-28 LAB — CBC
HCT: 40.5 % (ref 39.0–52.0)
Hemoglobin: 13.8 g/dL (ref 13.0–17.0)
MCH: 29.9 pg (ref 26.0–34.0)
MCHC: 34.1 g/dL (ref 30.0–36.0)
MCV: 87.7 fL (ref 78.0–100.0)
Platelets: 243 10*3/uL (ref 150–400)
RBC: 4.62 MIL/uL (ref 4.22–5.81)
RDW: 12.7 % (ref 11.5–15.5)
WBC: 8.3 10*3/uL (ref 4.0–10.5)

## 2011-01-28 LAB — SURGICAL PCR SCREEN
MRSA, PCR: NEGATIVE
Staphylococcus aureus: NEGATIVE

## 2011-01-31 ENCOUNTER — Other Ambulatory Visit: Payer: Self-pay | Admitting: Urology

## 2011-01-31 ENCOUNTER — Ambulatory Visit (HOSPITAL_COMMUNITY)
Admission: RE | Admit: 2011-01-31 | Discharge: 2011-01-31 | Disposition: A | Discharge status: Home / Self Care | Payer: BC Managed Care – PPO | Source: Ambulatory Visit | Attending: Urology | Admitting: Urology

## 2011-01-31 DIAGNOSIS — I509 Heart failure, unspecified: Secondary | ICD-10-CM | POA: Insufficient documentation

## 2011-01-31 DIAGNOSIS — Z01818 Encounter for other preprocedural examination: Secondary | ICD-10-CM | POA: Insufficient documentation

## 2011-01-31 DIAGNOSIS — Z01812 Encounter for preprocedural laboratory examination: Secondary | ICD-10-CM | POA: Insufficient documentation

## 2011-01-31 DIAGNOSIS — C659 Malignant neoplasm of unspecified renal pelvis: Secondary | ICD-10-CM | POA: Insufficient documentation

## 2011-01-31 DIAGNOSIS — I498 Other specified cardiac arrhythmias: Secondary | ICD-10-CM | POA: Insufficient documentation

## 2011-01-31 DIAGNOSIS — I10 Essential (primary) hypertension: Secondary | ICD-10-CM | POA: Insufficient documentation

## 2011-01-31 DIAGNOSIS — R310 Gross hematuria: Secondary | ICD-10-CM | POA: Insufficient documentation

## 2011-01-31 DIAGNOSIS — G4733 Obstructive sleep apnea (adult) (pediatric): Secondary | ICD-10-CM | POA: Insufficient documentation

## 2011-01-31 LAB — GLUCOSE, CAPILLARY
Glucose-Capillary: 318 mg/dL — ABNORMAL HIGH (ref 70–99)
Glucose-Capillary: 260 mg/dL — ABNORMAL HIGH (ref 70–99)
Glucose-Capillary: 232 mg/dL — ABNORMAL HIGH (ref 70–99)

## 2011-02-01 NOTE — Op Note (Signed)
NAMEHARUO, Donald Moore              ACCOUNT NO.:  [id number]  MEDICAL RECORD NO.:  [id number]           PATIENT TYPE:  LOCATION:                                 FACILITY:  PHYSICIAN:  Danae Chen, M.D.  DATE OF BIRTH:  Jul 04, 1961  DATE OF PROCEDURE:  01/03/2011 DATE OF DISCHARGE:                              OPERATIVE REPORT   PREOPERATIVE DIAGNOSIS:  Gross hematuria, rule out renal pelvis tumor.  POSTOPERATIVE DIAGNOSIS:  Gross hematuria, rule out renal pelvis tumor.  PROCEDURE DONE:  Cystoscopy, left retrograde pyelogram, ureteroscopy, brush biopsy of upper pole calyx, and insertion of double-J stent.  SURGEON:  Danae Chen, MD  ANESTHESIA:  General.  INDICATION:  The patient is a 50 year old male who has been having gross hematuria.  CT scan and MRI showed blood clots in the renal pelvis and no definite intrarenal lesion.  He had cystoscopy, left retrograde pyelogram, attempted ureteroscopy, and insertion of double-J stent for severe left flank pain about 3 weeks ago.   He had a biopsy of a bladder lesion that turned out to be benign.  He is scheduled today for cystoscopy and retrograde pyelogram and repeat ureteroscopy and possible biopsy of the renal pelvis.  The patient was identified by his wristband and proper time-out was taken.  Under general anesthesia, he was prepped and draped and placed in the dorsal lithotomy position.  A panendoscope was inserted in the bladder. The urethra was normal.  He had moderate prostatic hypertrophy.  There was a double-J stent coming out of the left ureteral orifice.  The right ureteral orifice was normal.  There was no stone or tumor in the bladder.  The double-J stent was grasped with a grasping forceps and pulled out of the urethra.  A sensor wire was then passed through the double-J stent and the double-J stent was removed.  An open-ended catheter was then passed over the sensor wire.  Retrograde pyelogram.  Contrast was then  injected through the open-ended catheter.  There is a filling defect in the upper pole calyx.  The other calyces appear normal and the renal pelvis also appears normal.  The open-ended catheter was then removed.  I attempted to pass the flexible cystoscope through the left ureteral orifice, but because of edema at the ureteral orifice, I was not able to pass the flexible ureteroscope.  I then passed a ureteroscope access sheath over the guidewire.  I then removed the guidewire and I passed the digital flexible ureteroscope through the ureteroscope access sheath.  The proximal ureter was normal.  I advanced the ureteroscope up to the upper pole calyx.  There were some blood clots in the upper pole calyx.  I was not able to clearly visualize a ureteral tumor.  There was some edema in the upper pole calyx.  I then irrigated the renal pelvis and the upper pole calyx with normal saline and washings were sent for cytology.  I asked Dr. Patsi Sears to come in and assist me during the ureteroscopy and he agreed that there was some edema at the level of the upper pole calyx without definite evidence of  papillary tumor. Then, I passed a brush biopsy through the flexible ureteroscope and biopsied the edematous area of the upper pole calyx.  The brush biopsy was then sent to Pathology.  The ureteroscope was then removed.  The sensor wire was then passed through the ureteroscope access sheath and I removed the ureteroscope access sheath.  The guidewire was then back loaded into the cystoscope and a #6-French - 26 Polaris double-J stent was passed over the guidewire.  The guidewire was then slowly removed and the proximal curl of the double-J stent was in the renal pelvis.  The guidewire was then removed and the loops of the Polaris stent were in the bladder.  The bladder was then emptied and the cystoscope removed.  The patient tolerated the procedure well and left the OR in satisfactory condition  to post-anesthesia care unit.     Danae Chen, M.D.     MN/MEDQ  D:  01/03/2011  T:  01/03/2011  Job:  [id number]  cc:   Brooke Bonito, M.D. Fax: [phone]  Electronically Signed by Lindaann Slough M.D. on 02/01/2011 01:28:57 PM

## 2011-02-19 NOTE — Op Note (Signed)
Donald Moore, Donald Moore              ACCOUNT NO.:  [id number]  MEDICAL RECORD NO.:  [id number]  LOCATION:  DAYL                         FACILITY:  [facility]  PHYSICIAN:  Donald Moore, M.D.  DATE OF BIRTH:  October 28, 1960  DATE OF PROCEDURE:  01/31/2011 DATE OF DISCHARGE:                              OPERATIVE REPORT   PREOPERATIVE DIAGNOSES: 1. Gross hematuria localized to the left kidney. 2. Filling defect of the upper pole infundibulum of the left kidney.  POSTOPERATIVE DIAGNOSES: 1. Gross hematuria localized to the left kidney. 2. Left upper pole infundibulum papillary lesion.  SURGEON:  Donald Moore, M.D.  ANESTHESIA:  General.  DRAINS:  A 6-French 26 cm double-J stent in the left ureter (no string)  SPECIMENS:  Biopsy of upper pole papillary lesion to pathology.  BLOOD LOSS:  Less than 10 cc.  COMPLICATIONS:  None.  INDICATIONS:  The patient is a 50 year old male who experienced gross painless hematuria initially.  He was evaluated and cystoscopically found to have a lesion within the bladder on the right-hand side, it was biopsied and turned out to be negative.  He then developed left flank pain with associated gross hematuria and a CT scan revealed what appeared to be a filling defect in the upper pole of the left kidney. He underwent an attempt at ureteroscopy, although the ureter would not allow passage of the scope and so the stent was left indwelling and repeat ureteroscopy was then performed and was successful; however, no lesion could be identified within the kidney.  Brush biopsies did reveal atypical but no definite malignant cells.  A stent was then left indwelling and he returns today for a repeat evaluation of the source of his gross hematuria.  In the interim, he has undergone a CT angiogram that did not reveal any arteriovenous malformations or definite bleeding points.  The risks, complications, alternatives and limitations were discussed with the  patient and he understands and has elected to proceed.  DESCRIPTION OF OPERATION:  After informed consent, the patient was brought to the major OR, placed on the table and administered general anesthesia in the supine position.  He was then moved to the dorsal lithotomy position and his genitalia was sterilely prepped and draped. An official time-out was then performed.  The 22-French cystoscope was then passed under direct vision down the urethra and into the bladder.  The bladder revealed the stent exiting the left ureter and this was grasped with alligator forceps and drawn out through the urethral meatus.  I then passed a 0.038-inch floppy tip guidewire through the stent and up the left ureter under fluoroscopic control to the level of the renal pelvis and then removed the stent.  The ureteral access sheath was then passed over the guidewire and into the area of the renal pelvis and the guidewire was removed with the access sheath left in place.  The 6-French digital flexible ureteroscope was then passed up the access sheath and in the area of the kidney.  I noted no immediate bleeding initially and the lateral portion of the upper pole calix appeared free of any lesions.  However, the medial portion clearly had several papillary lesions, most likely  representing TCCA.  These were photographed although the photographs were of poor quality.  Both Dr. Brunilda Moore who was in the room at the time of the procedure and myself felt that these most likely were papillary lesions of transitional cell carcinoma by their appearance.  A nitinol basket was then passed through the ureteroscope and I was able to engage the papillary tumor and was able to pull off several pieces that were then extracted and sent to pathology.  There was minimal bleeding.  It was felt that brushings and washing would likely not be as definitive as the actual biopsies, so I then replaced replaced the guidewire and removed  the access sheath and ureteroscope.  The cystoscope was back loaded over the guidewire and the stent was passed over the guidewire in the area of the renal pelvis and as the guidewire was removed good curl was noted in the renal pelvis and in the bladder.  The bladder was then drained and the patient was awakened and taken to recovery room in stable and satisfactory condition.  He tolerated the procedure well and there were no intraoperative complications.  He will return to the office in 1 week for followup.  I will then have the results of his pathology back and will discuss that with him.     Donald Moore C. Vernie Moore, M.D.     MCO/MEDQ  D:  01/31/2011  T:  01/31/2011  Job:  [id number]  Electronically Signed by Ihor Gully M.D. on 02/19/2011 04:48:14 AM

## 2011-02-27 ENCOUNTER — Encounter (HOSPITAL_COMMUNITY): Payer: BC Managed Care – PPO

## 2011-02-27 ENCOUNTER — Other Ambulatory Visit: Payer: Self-pay | Admitting: Urology

## 2011-02-27 LAB — SURGICAL PCR SCREEN
MRSA, PCR: NEGATIVE
Staphylococcus aureus: NEGATIVE

## 2011-02-27 LAB — CBC
Platelets: 220 10*3/uL (ref 150–400)
RBC: 4.3 MIL/uL (ref 4.22–5.81)
RDW: 13.2 % (ref 11.5–15.5)
WBC: 6.5 10*3/uL (ref 4.0–10.5)

## 2011-02-27 LAB — BASIC METABOLIC PANEL
CO2: 25 mEq/L (ref 19–32)
Chloride: 100 mEq/L (ref 96–112)
GFR calc Af Amer: >60 mL/min (ref >60)
Potassium: 4.3 mEq/L (ref 3.5–5.1)

## 2011-03-06 ENCOUNTER — Inpatient Hospital Stay (HOSPITAL_COMMUNITY)
Admission: RE | Admit: 2011-03-06 | Discharge: 2011-03-11 | DRG: 303 | Disposition: A | Discharge status: Home / Self Care | Payer: BC Managed Care – PPO | Source: Ambulatory Visit | Attending: Urology | Admitting: Urology

## 2011-03-06 ENCOUNTER — Inpatient Hospital Stay (HOSPITAL_COMMUNITY): Payer: BC Managed Care – PPO

## 2011-03-06 ENCOUNTER — Other Ambulatory Visit: Payer: Self-pay | Admitting: Urology

## 2011-03-06 DIAGNOSIS — E119 Type 2 diabetes mellitus without complications: Secondary | ICD-10-CM | POA: Diagnosis present

## 2011-03-06 DIAGNOSIS — G4730 Sleep apnea, unspecified: Secondary | ICD-10-CM | POA: Diagnosis present

## 2011-03-06 DIAGNOSIS — E7800 Pure hypercholesterolemia, unspecified: Secondary | ICD-10-CM | POA: Diagnosis present

## 2011-03-06 DIAGNOSIS — I519 Heart disease, unspecified: Secondary | ICD-10-CM | POA: Diagnosis present

## 2011-03-06 DIAGNOSIS — Z01812 Encounter for preprocedural laboratory examination: Secondary | ICD-10-CM

## 2011-03-06 DIAGNOSIS — I509 Heart failure, unspecified: Secondary | ICD-10-CM | POA: Diagnosis present

## 2011-03-06 DIAGNOSIS — C659 Malignant neoplasm of unspecified renal pelvis: Principal | ICD-10-CM | POA: Diagnosis present

## 2011-03-06 DIAGNOSIS — I10 Essential (primary) hypertension: Secondary | ICD-10-CM | POA: Diagnosis present

## 2011-03-06 LAB — GLUCOSE, CAPILLARY
Glucose-Capillary: 284 mg/dL — ABNORMAL HIGH (ref 70–99)
Glucose-Capillary: 263 mg/dL — ABNORMAL HIGH (ref 70–99)
Glucose-Capillary: 280 mg/dL — ABNORMAL HIGH (ref 70–99)
Glucose-Capillary: 246 mg/dL — ABNORMAL HIGH (ref 70–99)
Glucose-Capillary: 197 mg/dL — ABNORMAL HIGH (ref 70–99)

## 2011-03-06 LAB — TYPE AND SCREEN
ABO/RH(D): O POS
Antibody Screen: NEGATIVE

## 2011-03-06 LAB — BASIC METABOLIC PANEL
CO2: 26 mEq/L (ref 19–32)
Calcium: 8.4 mg/dL (ref 8.4–10.5)
Creatinine, Ser: 1.27 mg/dL (ref 0.50–1.35)
GFR calc non Af Amer: 60 mL/min — ABNORMAL LOW (ref >60)

## 2011-03-06 LAB — CBC
MCH: 30 pg (ref 26.0–34.0)
MCHC: 33.6 g/dL (ref 30.0–36.0)
MCV: 89.2 fL (ref 78.0–100.0)
Platelets: 198 10*3/uL (ref 150–400)
RBC: 3.7 MIL/uL — ABNORMAL LOW (ref 4.22–5.81)
RDW: 13.1 % (ref 11.5–15.5)

## 2011-03-07 LAB — GLUCOSE, CAPILLARY
Glucose-Capillary: 183 mg/dL — ABNORMAL HIGH (ref 70–99)
Glucose-Capillary: 251 mg/dL — ABNORMAL HIGH (ref 70–99)
Glucose-Capillary: 207 mg/dL — ABNORMAL HIGH (ref 70–99)
Glucose-Capillary: 185 mg/dL — ABNORMAL HIGH (ref 70–99)
Glucose-Capillary: 201 mg/dL — ABNORMAL HIGH (ref 70–99)

## 2011-03-07 LAB — CBC
MCV: 89.6 fL (ref 78.0–100.0)
Platelets: 184 10*3/uL (ref 150–400)
RBC: 3.36 MIL/uL — ABNORMAL LOW (ref 4.22–5.81)
WBC: 7.4 10*3/uL (ref 4.0–10.5)

## 2011-03-07 LAB — BASIC METABOLIC PANEL
CO2: 26 mEq/L (ref 19–32)
Chloride: 100 mEq/L (ref 96–112)
Creatinine, Ser: 1.38 mg/dL — ABNORMAL HIGH (ref 0.50–1.35)
Sodium: 133 mEq/L — ABNORMAL LOW (ref 135–145)

## 2011-03-07 NOTE — Op Note (Signed)
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  PROCEDURE: 1. Cystoscopy. 2. Right retrograde pyelography with interpretation. 3. Left laparoscopic nephroureterectomy.  SURGEON:  Heloise Purpura, M.D.  ASSISTANT:  Delia Chimes, NP-C  ANESTHESIA:  General.  COMPLICATIONS:  None.  ESTIMATED BLOOD LOSS:  100 cc.  INTRAVENOUS FLUIDS:  3 L of crystalloid and 500 cc of colloid.  SPECIMEN:  Left kidney, ureter, and bladder cuff.  DISPOSITION:  Specimen to pathology.  INTRAOPERATIVE FINDINGS:  Right retrograde pyelogram revealed a normal caliber ureter without evidence of any filling defects.  The right renal pelvis and collecting system also demonstrated no evidence of any obvious filling defects.  The lower pole initially was not able to be filled out very readily, but subsequently was seen to contain no filling defects.  INDICATIONS:  Mr. 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  He underwent metastatic evaluation as well as an examination of his remaining urothelium, which demonstrated no evidence of disease outside the left renal collecting system.  He was counseled regarding management options for treatment and elected to proceed with a left laparoscopic nephroureterectomy.  The potential risks, complications, and alternative treatment options were discussed in detail and informed consent was obtained.   Due to the fact that his right renal collecting system and right ureter had not been completely evaluated prior to this date, it was decided to proceed with further evaluation as noted above.  DESCRIPTION OF PROCEDURE:  The patient was taken to the operating room and general anesthetic was administered.  He was given preoperative antibiotics, placed in the dorsal lithotomy position, and prepped and draped in the usual sterile fashion.  Next, a preoperative time-out was performed.  Cystourethroscopy revealed a normal anterior and posterior urethra.  Inspection of the bladder revealed significant amount of blood within the bladder, likely related to his ongoing hematuria from his left renal tumor.  An indwelling left ureteral stent was identified and a survey of the bladder revealed no evidence of any bladder tumors, stones, or other mucosal pathology.  The right ureteral orifice was identified and intubated with a 6-French ureteral catheter and Omnipaque contrast was injected.  Findings are as dictated above.  In summary, there was no evidence to suggest a contralateral urothelial tumor. Attention then turned at the second portion of the procedure.  A three- way Foley catheter was sterilely inserted into his bladder and he was repositioned in the left modified flank position with care to pad all potential pressure points.  His abdomen was again prepped and draped in the usual sterile fashion and another preoperative time-out was performed.  A site  was selected just to the left of the umbilicus for placement of the camera port.  This was placed using a standard open Hassan technique, which allowed entry into the peritoneal cavity under direct vision without difficulty.  0 Vicryl holding sutures were then placed and used to secure the 12-mm Hassan cannula.  A pneumoperitoneum was established and a 30-degree lens was used to inspect the abdomen without evidence of any significant  abnormalities or injuries.  A 12 mm port was then placed in the left lower quadrant and a 5 mm port placed in the left upper quadrant.  Using the harmonic scalpel, the white line of Toldt was incised along the length of the descending colon allowing the colon to be mobilized medially in the space between the mesocolon and the anterior layer of Gerota fascia to be developed.  Due to difficulty with retracting the bowel medially, another 5 mm laterally placed port was placed in the left abdominal wall.  The ureter and gonadal vein were identified and were able to be lifted anteriorly off the psoas muscle.  Dissection proceeded superiorly toward the renal hilum and there was noted to be a small venous tributary entering the main renal vein.  There was a small amount of bleeding from this area, which was initially controlled with compression and subsequently ligated with 5 mm Hem-o-Lok clips, which did require placement of another 12 mm lower midline port site.  There were noted to be 2 renal arteries, which were able to be isolated and divided between multiple Hem-o-Lok clips. The renal vein was then isolated with right-angle dissection and was stapled and divided with a 45 mm Flex ETS stapler.  Gerota fascia was intentionally entered superiorly allowing the adrenal gland to be spared.  The superior attachments of the kidney were taken down with the harmonic scalpel allowing the splenorenal ligaments to be divided.  The lateral attachments to the kidney were also divided with a harmonic scalpel until the kidney was able to be completely freed.  In addition, the gonadal vein was identified inferiorly and was isolated and divided between Hem-o-Lok clips.  The ureter was then dissected down toward the pelvis across the iliac vessels.  The patient was placed into Trendelenburg position and utilizing the 3 lower 12 mm port sites, the ureter was able to be dissected all the way down to the bladder.   The space of Retzius was developed after the lateral aspect of the bladder was dropped.  The superior vesical artery was prospectively identified and divided between multiple 5-mm Hem-o-Lok clips.  Once the ureter had been mobilized down to the bladder, preparations were made to make a lower midline incision.  The previously placed camera port site and right lower quadrant 12-mm port site were closed with 0 Vicryl sutures placed laparoscopically.  All port sites were removed under direct vision and there appeared to be excellent hemostasis at the end of the laparoscopic portion of the procedure.  A lower midline incision was then extended down from the 12 mm lower midline port site and the subcutaneous tissue underlying was divided.  The midline was identified and the rectus muscles were separated in the transversalis fascia was divided.  This allowed the space of Retzius to be developed and opening to the peritoneum identified and made contiguous with the lower midline incision.  The kidney specimen was able to be brought out through the lower midline incision and a self retracting Bookwalter retractor was placed.  The ureter was identified down  to the bladder and a few remaining adventitial areas were divided with right-angle dissection and cautery.  The mucosa was identified and a right-angle was inserted into the bladder, allowing the mucosa to be entered and the bladder cuff to be excised with the tumor specimen and removed.  Of note, Hem-o-Lok clip had been placed on the proximal ureter earlier in the case to minimize tumor spillage.  Once the specimen was removed, the mucosal opening was then examined and closed with a running 2-0 Vicryl layer followed by an imbricating 2-0 Vicryl layer.  The bladder was then filled with saline, which was circulated through the three-way catheter and there did not appear to be any urine leak from the bladder.  A #19 Blake drain was brought through  a separate stab incision in the left lower quadrant and placed in the perivesical space.  It was secured to skin with a nylon suture.  All port sites were then injected with long-acting bupivacaine (Exparel) both into the deep muscular layer and subcutaneous tissues. The lower midline incision was then closed with 2 running #1 PDS looped sutures.  Copious irrigation was used both in the deep wound and subsequently in the superficial wound and the skin incisions were all reapproximated with staples.  The patient appeared to tolerate procedure well without complications.  He was able to be extubated and transferred to recovery unit in satisfactory condition.     Heloise Purpura, MD     LB/MEDQ  D:  03/06/2011  T:  03/06/2011  Job:  [id number]  Electronically Signed by Heloise Purpura MD on 03/07/2011 11:07:33 PM

## 2011-03-08 LAB — BASIC METABOLIC PANEL
BUN: 8 mg/dL (ref 6–23)
CO2: 25 mEq/L (ref 19–32)
Calcium: 8.5 mg/dL (ref 8.4–10.5)
Creatinine, Ser: 1.29 mg/dL (ref 0.50–1.35)

## 2011-03-08 LAB — GLUCOSE, CAPILLARY
Glucose-Capillary: 204 mg/dL — ABNORMAL HIGH (ref 70–99)
Glucose-Capillary: 234 mg/dL — ABNORMAL HIGH (ref 70–99)
Glucose-Capillary: 219 mg/dL — ABNORMAL HIGH (ref 70–99)

## 2011-03-09 LAB — CREATININE, FLUID (PLEURAL, PERITONEAL, JP DRAINAGE): Creat, Fluid: 1.2 mg/dL

## 2011-03-09 LAB — GLUCOSE, CAPILLARY: Glucose-Capillary: 266 mg/dL — ABNORMAL HIGH (ref 70–99)

## 2011-03-10 ENCOUNTER — Inpatient Hospital Stay (HOSPITAL_COMMUNITY): Payer: BC Managed Care – PPO

## 2011-03-10 LAB — GLUCOSE, CAPILLARY
Glucose-Capillary: 245 mg/dL — ABNORMAL HIGH (ref 70–99)
Glucose-Capillary: 210 mg/dL — ABNORMAL HIGH (ref 70–99)

## 2011-03-10 MED ORDER — DIATRIZOATE MEGLUMINE 30 % UR SOLN
Freq: Once | URETHRAL | Status: AC | PRN
  Administered 2011-03-10: 12:00:00

## 2011-03-11 LAB — GLUCOSE, CAPILLARY: Glucose-Capillary: 246 mg/dL — ABNORMAL HIGH (ref 70–99)

## 2011-03-31 NOTE — Discharge Summary (Signed)
[name], [name]              ACCOUNT NO.:  [id number]  MEDICAL RECORD NO.:  [id number]  LOCATION:  1427                         FACILITY:  [facility]  PHYSICIAN:  Heloise Purpura, MD      DATE OF BIRTH:  Apr 17, 1961  DATE OF ADMISSION:  03/06/2011 DATE OF DISCHARGE:  03/11/2011                              DISCHARGE SUMMARY   ADMISSION DIAGNOSIS:  Urothelial carcinoma of left renal pelvis.  DISCHARGE DIAGNOSIS:  Superficially invasive high-grade urothelial cell carcinoma involving the renal pelvis, tumor code pT1, pNX.  PROCEDURES: 1. Cystoscopy. 2. Right retrograde pyelography with interpretation. 3. Left laparoscopic nephroureterectomy.  HISTORY AND PHYSICAL:  For full details, please see admission history and physical.  Briefly, [name] is a 50 year old gentleman with a history of gross hematuria, who underwent an evaluation and was subsequently found to have high-grade urothelial carcinoma of the left renal collecting system.  He underwent metastatic evaluation as well and an examination of his remaining urothelium, which demonstrated no evidence of disease outside of the left renal collecting system.  After consideration regarding management options for treatment, he did elect to proceed with surgical therapy and a left laparoscopic nephroureterectomy.  Due to the fact that his right renal collecting system and right ureter had not been completely evaluated prior to this date, it was also decided that he would undergo a right cystoscopy with right retrograde pyelography and interpretation.  HOSPITAL COURSE:  On March 06, 2011, he was taken to the operating room where he underwent the above named procedures and which he tolerated well and without complications.  Postoperatively, he was able to be transferred to a regular hospital room following recovery from anesthesia.  He was found to be hemodynamically stable postoperatively as noted by hemoglobin of 11.1.  He was  also found to be hemodynamically stable on postoperative day #1 as noted by hemoglobin of 10.3.  His serum creatinine was 1.27 postoperatively.  It did increase to 1.38 into postoperative day #1, but subsequently decreased to 1.29 and postoperative day #2.  Postoperatively, he was able to begin a clear liquid diet and which he was able to be transitioned to a regular diet over the course of the next 24-48 hours.  He was able to begin ambulation postoperatively as well.  By postoperative day #3, he was able to be transitioned to oral pain medications.  On the afternoon of postoperative day #3, he began to complain of increased incisional pain and some erythema around the incision site.  It had decreased somewhat over the prior 12 hours, but he did continue to have complaints.  He was found to be afebrile.  On postoperative day #4, he was started on prophylactic antibiotic therapy.  On postoperative day #4, he was sent for a cystogram to check for any extravasation secondary to surgical procedure.  It returned showing no extravasation or leak.  Therefore on postoperative day #4, his Foley catheter was removed.  Fluid from his pelvic drain was sent to check for creatinine and returned consistent with serum at 1.2.  Therefore, his pelvic drain was removed as well.  On postoperative day #5, he was ambulating without difficulty.  His pain was well managed.  His incisional pain had somewhat decreased.  He was tolerating a regular diet and voiding without difficulty.  Therefore he was felt stable for discharge home as he had met all discharge criteria.  DISPOSITION:  Home.  DISCHARGE MEDICATIONS:  He was instructed to resume all home medications.  In addition, he was provided a prescription for Vicodin to take as needed for pain and told to use Colace as a stool softener.  He was also given a prescription for Cipro to take for 5 days.  DISCHARGE INSTRUCTIONS:  He was instructed to be  ambulatory, but specifically told to refrain from any heavy lifting, strenuous activity or driving.  He was instructed to resume a regular diet.  FOLLOWUP:  He will follow up in 9 days for further postoperative evaluation as well as removal of skin staples.  PATHOLOGY:  His pathology returned as superficially invasive high-grade urothelial cell carcinoma involving the renal pelvis, tumor stage pT1, pNX.  His pathology was discussed per Dr. Laverle Patter prior to discharge from hospital.     Delia Chimes, NP   ______________________________ Heloise Purpura, MD    MA/MEDQ  D:  03/11/2011  T:  03/11/2011  Job:  [id number]  Electronically Signed by Delia Chimes NP on 03/14/2011 10:26:37 AM Electronically Signed by Heloise Purpura MD on 03/31/2011 06:34:41 PM

## 2011-09-03 ENCOUNTER — Other Ambulatory Visit (HOSPITAL_COMMUNITY): Payer: Self-pay | Admitting: Urology

## 2011-09-03 ENCOUNTER — Ambulatory Visit (HOSPITAL_COMMUNITY)
Admission: RE | Admit: 2011-09-03 | Discharge: 2011-09-03 | Disposition: A | Discharge status: Home / Self Care | Payer: BC Managed Care – PPO | Source: Ambulatory Visit | Attending: Urology | Admitting: Urology

## 2011-09-03 DIAGNOSIS — C659 Malignant neoplasm of unspecified renal pelvis: Secondary | ICD-10-CM

## 2012-03-17 ENCOUNTER — Other Ambulatory Visit (HOSPITAL_COMMUNITY): Payer: Self-pay | Admitting: Urology

## 2012-03-17 ENCOUNTER — Ambulatory Visit (HOSPITAL_COMMUNITY)
Admission: RE | Admit: 2012-03-17 | Discharge: 2012-03-17 | Disposition: A | Discharge status: Home / Self Care | Payer: BC Managed Care – PPO | Source: Ambulatory Visit | Attending: Urology | Admitting: Urology

## 2012-03-17 DIAGNOSIS — Z905 Acquired absence of kidney: Secondary | ICD-10-CM | POA: Insufficient documentation

## 2012-03-17 DIAGNOSIS — C659 Malignant neoplasm of unspecified renal pelvis: Secondary | ICD-10-CM

## 2012-09-21 ENCOUNTER — Other Ambulatory Visit (HOSPITAL_COMMUNITY): Payer: Self-pay | Admitting: Urology

## 2012-09-21 ENCOUNTER — Ambulatory Visit (HOSPITAL_COMMUNITY)
Admission: RE | Admit: 2012-09-21 | Discharge: 2012-09-21 | Disposition: A | Discharge status: Home / Self Care | Payer: 59 | Source: Ambulatory Visit | Attending: Urology | Admitting: Urology

## 2012-09-21 DIAGNOSIS — C659 Malignant neoplasm of unspecified renal pelvis: Secondary | ICD-10-CM

## 2012-09-21 DIAGNOSIS — I10 Essential (primary) hypertension: Secondary | ICD-10-CM | POA: Insufficient documentation

## 2013-02-02 IMAGING — CR DG CHEST 2V
2 series · 2 of 2 positions shown · non-contrast
Comparison: Portable chest x-ray of 03/13/2009

CLINICAL DATA: Preop for cystoscopy, gross hematuria

CHEST - 2 VIEW

[w chest pa *]
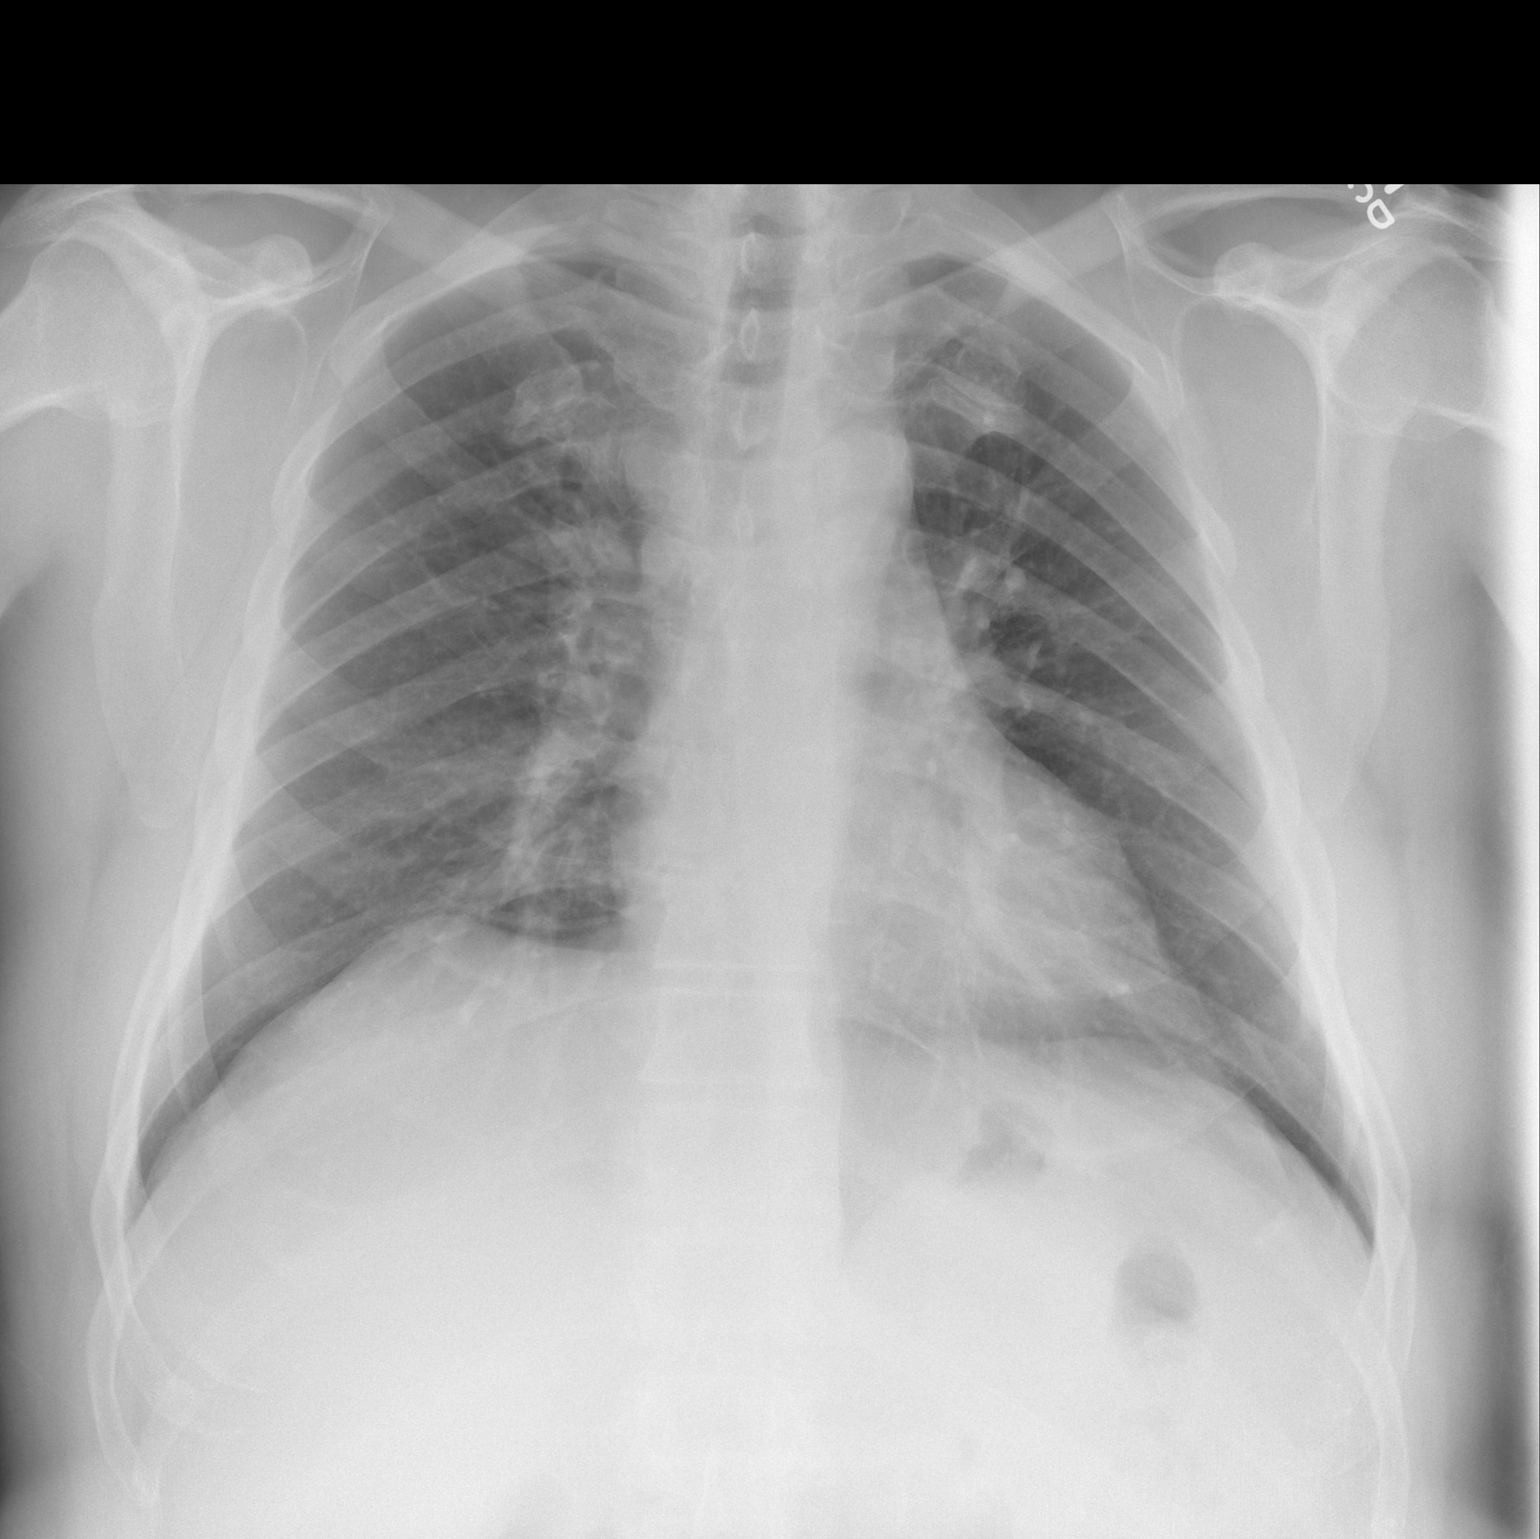

[w chest lat]
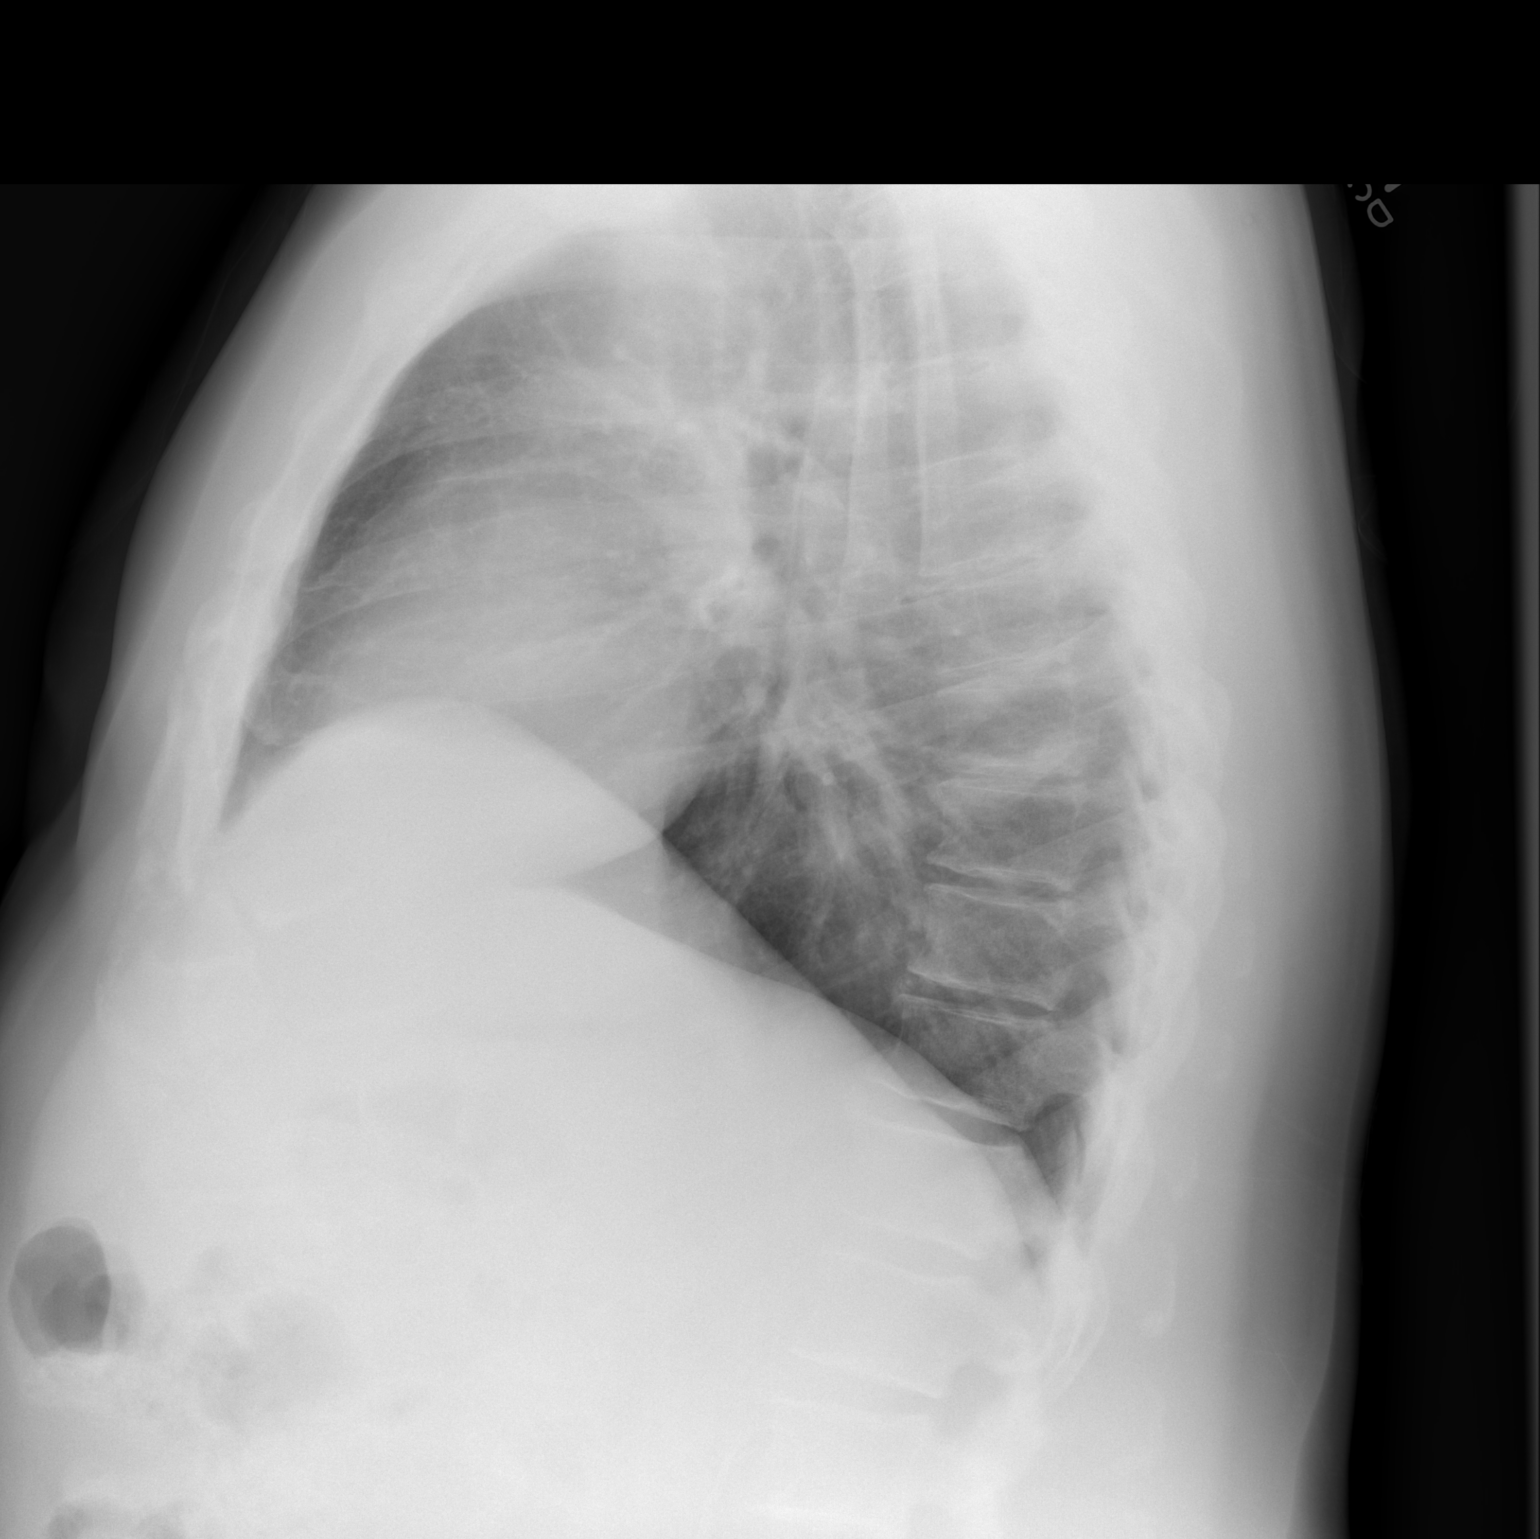

[2 of 2 positions shown; findings below may reference images not displayed]

FINDINGS: The lungs are clear.  Mediastinal contours appear stable.
The heart is within normal limits in size.  No acute bony
abnormality is seen.
IMPRESSION: Stable chest x-ray.  No active lung disease.

## 2013-03-28 ENCOUNTER — Ambulatory Visit (HOSPITAL_COMMUNITY)
Admission: RE | Admit: 2013-03-28 | Discharge: 2013-03-28 | Disposition: A | Discharge status: Home / Self Care | Payer: 59 | Source: Ambulatory Visit | Attending: Urology | Admitting: Urology

## 2013-03-28 ENCOUNTER — Other Ambulatory Visit (HOSPITAL_COMMUNITY): Payer: Self-pay | Admitting: Urology

## 2013-03-28 DIAGNOSIS — C649 Malignant neoplasm of unspecified kidney, except renal pelvis: Secondary | ICD-10-CM | POA: Insufficient documentation

## 2013-03-28 DIAGNOSIS — C659 Malignant neoplasm of unspecified renal pelvis: Secondary | ICD-10-CM

## 2014-08-11 DIAGNOSIS — D126 Benign neoplasm of colon, unspecified: Secondary | ICD-10-CM

## 2014-08-11 HISTORY — DX: Benign neoplasm of colon, unspecified: D12.6

## 2014-09-21 ENCOUNTER — Encounter: Payer: Self-pay | Admitting: Gastroenterology

## 2014-09-28 ENCOUNTER — Telehealth: Payer: Self-pay

## 2014-09-28 NOTE — Telephone Encounter (Signed)
Office visit given low EF. I have not seen this patient before.

## 2014-09-28 NOTE — Telephone Encounter (Signed)
Dr Fuller Plan, Do you want an OV or direct hospital appt for EF 20-25%?  Thanks, Daenerys Buttram/ PV

## 2014-09-28 NOTE — Telephone Encounter (Signed)
Pt needs hospital appt.  Last EF was in 2010 and was 20-25%.  Double-check please.  Thank you, Angela/previsit

## 2014-09-29 ENCOUNTER — Encounter: Payer: Self-pay | Admitting: Gastroenterology

## 2014-10-16 ENCOUNTER — Encounter: Payer: BC Managed Care – PPO | Admitting: Gastroenterology

## 2014-11-07 ENCOUNTER — Other Ambulatory Visit: Payer: Self-pay

## 2014-11-15 ENCOUNTER — Ambulatory Visit (INDEPENDENT_AMBULATORY_CARE_PROVIDER_SITE_OTHER): Payer: 59 | Admitting: Gastroenterology

## 2014-11-15 ENCOUNTER — Encounter: Payer: Self-pay | Admitting: Gastroenterology

## 2014-11-15 VITALS — BP 110/74 | HR 64 | Ht 72.0 in | Wt 282.4 lb

## 2014-11-15 DIAGNOSIS — Z1211 Encounter for screening for malignant neoplasm of colon: Secondary | ICD-10-CM | POA: Diagnosis not present

## 2014-11-15 DIAGNOSIS — I420 Dilated cardiomyopathy: Secondary | ICD-10-CM | POA: Diagnosis not present

## 2014-11-15 MED ORDER — NA SULFATE-K SULFATE-MG SULF 17.5-3.13-1.6 GM/177ML PO SOLN: 1.0000 | Freq: Once | ORAL | Status: DC

## 2014-11-15 NOTE — Progress Notes (Signed)
History of Present Illness: This is a  referred by Anda Kraft, MD for the evaluation of colon cancer screening with a history of congestive heart failure. He is accompanied by his wife. Echocardiogram from August 2010 shows an estimated ejection fraction of 20-25% diffuse hypokinesis and a mildly dilated left atrium. Had ICD placed in 2014. Followed by [facility] about every 6 months he relates subsequent echocardiograms have shown a similar ejection fraction. Denies SOB, DOE, weight loss, abdominal pain, constipation, diarrhea, change in stool caliber, melena, hematochezia, nausea, vomiting, dysphagia, reflux symptoms, chest pain.  No Known Allergies Outpatient Prescriptions Prior to Visit  Medication Sig Dispense Refill  . acetaminophen (TYLENOL) 500 MG tablet Take 500 mg by mouth every 6 (six) hours as needed.    Marland Kitchen aspirin 81 MG tablet Take 81 mg by mouth daily.    . carvedilol (COREG) 25 MG tablet 1/2 tablet by mouth twice daily    . citalopram (CELEXA) 20 MG tablet Take 20 mg by mouth daily.    . Liraglutide 18 MG/3ML SOPN Inject 16 mg into the skin daily.    . metFORMIN (GLUCOPHAGE-XR) 500 MG 24 hr tablet Take 500 mg by mouth daily with breakfast.    . ramipril (ALTACE) 5 MG capsule Take 5 mg by mouth 2 (two) times daily.    . rosuvastatin (CRESTOR) 20 MG tablet Take 20 mg by mouth daily.    . tamsulosin (FLOMAX) 0.4 MG CAPS capsule Take 0.4 mg by mouth daily.    . insulin glargine (LANTUS) 100 UNIT/ML injection Inject 10 Units into the skin 2 (two) times daily.    . Insulin Glargine (LANTUS) 100 UNIT/ML Solostar Pen Inject 20 Units into the skin 2 (two) times daily.     No facility-administered medications prior to visit.   Past Medical History  Diagnosis Date  . Diabetes mellitus   . CHF (congestive heart failure)   . Cancer of left kidney   . Hypertension   . Hyperlipemia   . Anxiety and depression    Past Surgical History  Procedure Laterality Date  . Nephrectomy     . Implantable cardioverter defibrillator implant     History   Social History  . Marital Status: Married    Spouse Name: N/A  . Number of Children: 2  . Years of Education: N/A   Occupational History  . Truck Geophysicist/field seismologist    Social History Main Topics  . Smoking status: Former Research scientist (life sciences)  . Smokeless tobacco: Never Used  . Alcohol Use: No  . Drug Use: No  . Sexual Activity: Not on file   Other Topics Concern  . None   Social History Narrative   Family History  Problem Relation Age of Onset  . Prostate cancer Paternal Uncle   . Diabetes Maternal Grandmother   . Heart disease Father   . Heart disease Mother        Review of Systems: Pertinent positive and negative review of systems were noted in the above HPI section. All other review of systems were otherwise negative.   Physical Exam: General: Well developed, well nourished, no acute distress Head: Normocephalic and atraumatic Eyes:  sclerae anicteric, EOMI Ears: Normal auditory acuity Mouth: No deformity or lesions Neck: Supple, no masses or thyromegaly Lungs: Clear throughout to auscultation Heart: Regular rate and rhythm; no murmurs, rubs or bruits Abdomen: Soft, non tender and non distended. No masses, hepatosplenomegaly or hernias noted. Normal Bowel sounds Rectal: deferred to colonoscopy Musculoskeletal: Symmetrical with  no gross deformities  Skin: No lesions on visible extremities Pulses:  Normal pulses noted Extremities: No clubbing, cyanosis, edema or deformities noted Neurological: Alert oriented x 4, grossly nonfocal Cervical Nodes:  No significant cervical adenopathy Inguinal Nodes: No significant inguinal adenopathy Psychological:  Alert and cooperative. Normal mood and affect  Assessment and Recommendations:  1. Colorectal cancer screening, average risk. Offered Cologuard and colonoscopy.The risks (including bleeding, perforation, infection, missed lesions, medication reactions and possible  hospitalization or surgery if complications occur), benefits, and alternatives to colonoscopy with possible biopsy and possible polypectomy were discussed with the patient and they consent to proceed.   2. Congestive heart failure and cardiomyopathy S/P ICD placement. Elevated cardiopulmonary risk with sedation and procedures. ASA IV. Pt declined Cologuard.    cc: Anda Kraft, MD [address] [place], [place] [address]

## 2014-11-15 NOTE — Patient Instructions (Signed)
You have been scheduled for a colonoscopy. Please follow written instructions given to you at your visit today.  Please pick up your prep supplies at the pharmacy within the next 1-3 days. If you use inhalers (even only as needed), please bring them with you on the day of your procedure.  Thank you for choosing me and [facility].  Pricilla Riffle. Dagoberto Ligas., MD., Marval Regal  cc: Anda Kraft, MD

## 2014-12-28 ENCOUNTER — Encounter (HOSPITAL_COMMUNITY): Payer: Self-pay | Admitting: *Deleted

## 2015-01-01 NOTE — Anesthesia Preprocedure Evaluation (Addendum)
Anesthesia Evaluation  Patient identified by MRN, date of birth, ID band Patient awake    Reviewed: Allergy & Precautions, NPO status , Patient's Chart, lab work & pertinent test results, reviewed documented beta blocker date and time   Airway Mallampati: II   Neck ROM: Full    Dental  (+) Dental Advisory Given, Teeth Intact   Pulmonary sleep apnea and Continuous Positive Airway Pressure Ventilation , former smoker,  breath sounds clear to auscultation        Cardiovascular hypertension, Pt. on medications +CHF + Cardiac Defibrillator Rhythm:Regular  ECHO EF 35%, ICD since 2014, followed by [facility] cardiology   Neuro/Psych    GI/Hepatic   Endo/Other  diabetes, Type 2  Renal/GU      Musculoskeletal   Abdominal (+)  Abdomen: soft.    Peds  Hematology   Anesthesia Other Findings   Reproductive/Obstetrics                           Anesthesia Physical Anesthesia Plan  ASA: III  Anesthesia Plan: MAC   Post-op Pain Management:    Induction: Intravenous  Airway Management Planned: Nasal Cannula  Additional Equipment:   Intra-op Plan:   Post-operative Plan:   Informed Consent: I have reviewed the patients History and Physical, chart, labs and discussed the procedure including the risks, benefits and alternatives for the proposed anesthesia with the patient or authorized representative who has indicated his/her understanding and acceptance.     Plan Discussed with:   Anesthesia Plan Comments:         Anesthesia Quick Evaluation

## 2015-01-02 ENCOUNTER — Ambulatory Visit (HOSPITAL_COMMUNITY): Payer: 59 | Admitting: Anesthesiology

## 2015-01-02 ENCOUNTER — Ambulatory Visit (HOSPITAL_COMMUNITY)
Admission: RE | Admit: 2015-01-02 | Discharge: 2015-01-02 | Disposition: A | Discharge status: Home / Self Care | Payer: 59 | Source: Ambulatory Visit | Attending: Gastroenterology | Admitting: Gastroenterology

## 2015-01-02 ENCOUNTER — Encounter (HOSPITAL_COMMUNITY)
Admission: RE | Disposition: A | Discharge status: Home / Self Care | Payer: Self-pay | Source: Ambulatory Visit | Attending: Gastroenterology

## 2015-01-02 ENCOUNTER — Encounter (HOSPITAL_COMMUNITY): Payer: Self-pay | Admitting: *Deleted

## 2015-01-02 DIAGNOSIS — Z95810 Presence of automatic (implantable) cardiac defibrillator: Secondary | ICD-10-CM | POA: Insufficient documentation

## 2015-01-02 DIAGNOSIS — Z9989 Dependence on other enabling machines and devices: Secondary | ICD-10-CM | POA: Insufficient documentation

## 2015-01-02 DIAGNOSIS — K640 First degree hemorrhoids: Secondary | ICD-10-CM | POA: Insufficient documentation

## 2015-01-02 DIAGNOSIS — I429 Cardiomyopathy, unspecified: Secondary | ICD-10-CM | POA: Insufficient documentation

## 2015-01-02 DIAGNOSIS — E119 Type 2 diabetes mellitus without complications: Secondary | ICD-10-CM | POA: Diagnosis not present

## 2015-01-02 DIAGNOSIS — Z1211 Encounter for screening for malignant neoplasm of colon: Secondary | ICD-10-CM | POA: Diagnosis not present

## 2015-01-02 DIAGNOSIS — Z905 Acquired absence of kidney: Secondary | ICD-10-CM | POA: Diagnosis not present

## 2015-01-02 DIAGNOSIS — E785 Hyperlipidemia, unspecified: Secondary | ICD-10-CM | POA: Insufficient documentation

## 2015-01-02 DIAGNOSIS — Z85528 Personal history of other malignant neoplasm of kidney: Secondary | ICD-10-CM | POA: Insufficient documentation

## 2015-01-02 DIAGNOSIS — D123 Benign neoplasm of transverse colon: Secondary | ICD-10-CM

## 2015-01-02 DIAGNOSIS — D125 Benign neoplasm of sigmoid colon: Secondary | ICD-10-CM

## 2015-01-02 DIAGNOSIS — F419 Anxiety disorder, unspecified: Secondary | ICD-10-CM | POA: Insufficient documentation

## 2015-01-02 DIAGNOSIS — I420 Dilated cardiomyopathy: Secondary | ICD-10-CM

## 2015-01-02 DIAGNOSIS — G4730 Sleep apnea, unspecified: Secondary | ICD-10-CM | POA: Insufficient documentation

## 2015-01-02 DIAGNOSIS — I509 Heart failure, unspecified: Secondary | ICD-10-CM | POA: Diagnosis not present

## 2015-01-02 DIAGNOSIS — F329 Major depressive disorder, single episode, unspecified: Secondary | ICD-10-CM | POA: Diagnosis not present

## 2015-01-02 DIAGNOSIS — Z87891 Personal history of nicotine dependence: Secondary | ICD-10-CM | POA: Insufficient documentation

## 2015-01-02 DIAGNOSIS — Z79899 Other long term (current) drug therapy: Secondary | ICD-10-CM | POA: Insufficient documentation

## 2015-01-02 DIAGNOSIS — Z794 Long term (current) use of insulin: Secondary | ICD-10-CM | POA: Insufficient documentation

## 2015-01-02 DIAGNOSIS — I10 Essential (primary) hypertension: Secondary | ICD-10-CM | POA: Diagnosis not present

## 2015-01-02 DIAGNOSIS — Z7982 Long term (current) use of aspirin: Secondary | ICD-10-CM | POA: Diagnosis not present

## 2015-01-02 HISTORY — PX: COLONOSCOPY WITH PROPOFOL: SHX5780

## 2015-01-02 HISTORY — DX: Sleep apnea, unspecified: G47.30

## 2015-01-02 HISTORY — DX: Presence of automatic (implantable) cardiac defibrillator: Z95.810

## 2015-01-02 LAB — GLUCOSE, CAPILLARY: Glucose-Capillary: 200 mg/dL — ABNORMAL HIGH (ref 65–99)

## 2015-01-02 SURGERY — COLONOSCOPY WITH PROPOFOL
Anesthesia: Monitor Anesthesia Care

## 2015-01-02 MED ORDER — LIDOCAINE HCL (CARDIAC) 20 MG/ML IV SOLN
INTRAVENOUS | Status: DC | PRN
  Administered 2015-01-02: 100 mg via INTRAVENOUS

## 2015-01-02 MED ORDER — PROPOFOL INFUSION 10 MG/ML OPTIME
INTRAVENOUS | Status: DC | PRN
  Administered 2015-01-02: 180 ug/kg/min via INTRAVENOUS

## 2015-01-02 MED ORDER — PROPOFOL 10 MG/ML IV BOLUS
INTRAVENOUS | Status: AC
  Filled 2015-01-02: qty 20

## 2015-01-02 MED ORDER — EPHEDRINE SULFATE 50 MG/ML IJ SOLN
INTRAMUSCULAR | Status: AC
  Filled 2015-01-02: qty 1

## 2015-01-02 MED ORDER — SODIUM CHLORIDE 0.9 % IJ SOLN
INTRAMUSCULAR | Status: AC
Start: 2015-01-02 — End: 2015-01-02
  Filled 2015-01-02: qty 10

## 2015-01-02 MED ORDER — SODIUM CHLORIDE 0.9 % IV SOLN
INTRAVENOUS | Status: DC
  Administered 2015-01-02: 1000 mL via INTRAVENOUS

## 2015-01-02 MED ORDER — LIDOCAINE HCL (CARDIAC) 20 MG/ML IV SOLN
INTRAVENOUS | Status: AC
  Filled 2015-01-02: qty 5

## 2015-01-02 SURGICAL SUPPLY — 21 items

## 2015-01-02 NOTE — Anesthesia Procedure Notes (Signed)
Procedure Name: MAC Date/Time: 01/02/2015 9:22 AM Performed by: Carleene Cooper A Pre-anesthesia Checklist: Timeout performed, Patient identified, Emergency Drugs available, Suction available and Patient being monitored Patient Re-evaluated:Patient Re-evaluated prior to inductionOxygen Delivery Method: Nasal cannula Dental Injury: Teeth and Oropharynx as per pre-operative assessment

## 2015-01-02 NOTE — Op Note (Signed)
[facility] [place], [address]   COLONOSCOPY PROCEDURE REPORT  PATIENT: Donald Moore, Donald Moore  MR#: [id number] BIRTHDATE: December 08, 1960 , 58  yrs. old GENDER: male ENDOSCOPIST: Ladene Artist, MD, [facility] REFERRED BY: Gareth Eagle MD PROCEDURE DATE:  01/02/2015 PROCEDURE:   Colonoscopy, screening and Colonoscopy with snare polypectomy First Screening Colonoscopy - Avg.  risk and is 50 yrs.  old or older Yes.  Prior Negative Screening - Now for repeat screening. N/A  History of Adenoma - Now for follow-up colonoscopy & has been > or = to 3 yrs.  N/A  Polyps removed today? Yes ASA CLASS:   Class IV INDICATIONS:Screening for colonic neoplasia and Colorectal Neoplasm Risk Assessment for this procedure is average risk. MEDICATIONS: Monitored anesthesia care and Per Anesthesia DESCRIPTION OF PROCEDURE:   After the risks benefits and alternatives of the procedure were thoroughly explained, informed consent was obtained.  The digital rectal exam revealed no abnormalities of the rectum.   The Pentax Ped Colon [id number] endoscope was introduced through the anus and advanced to the cecum, which was identified by both the appendix and ileocecal valve. No adverse events experienced.   The quality of the prep was good.  (Suprep was used)  The instrument was then slowly withdrawn as the colon was fully examined. Estimated blood loss is zero unless otherwise noted in this procedure report.    COLON FINDINGS: Two sessile polyps measuring 6 mm in size were found in the sigmoid colon and transverse colon.  Polypectomies were performed with a cold snare.  The resection was complete, the polyp tissue was completely retrieved and sent to histology.   The examination was otherwise normal.  Retroflexed views revealed internal Grade I hemorrhoids. The time to cecum = 1.6 Withdrawal time = 12.7   The scope was withdrawn and the procedure completed. COMPLICATIONS: There were no  immediate complications.  ENDOSCOPIC IMPRESSION: 1.   Two sessile polyps in the sigmoid colon and transverse colon; polypectomies performed with a cold snare 2.   Grade l internal hemorrhoids  RECOMMENDATIONS: 1.  Await pathology results 2.  Repeat colonoscopy in 5 years if polyp(s) adenomatous; otherwise 10 years  eSigned:  Ladene Artist, MD, [facility] 01/02/2015 9:48 AM  [C

## 2015-01-02 NOTE — Anesthesia Postprocedure Evaluation (Signed)
  Anesthesia Post-op Note  Patient: Donald Moore  Procedure(s) Performed: Procedure(s): COLONOSCOPY WITH PROPOFOL (N/A)  Patient Location: PACU  Anesthesia Type:MAC  Level of Consciousness: awake  Airway and Oxygen Therapy: Patient Spontanous Breathing  Post-op Pain: none  Post-op Assessment: Post-op Vital signs reviewed  Post-op Vital Signs: Reviewed and stable  Last Vitals:  Filed Vitals:   01/02/15 1014  BP: 110/75  Pulse: 62  Temp:   Resp: 17    Complications: No apparent anesthesia complications

## 2015-01-02 NOTE — Discharge Instructions (Signed)
Colonoscopy, Care After °These instructions give you information on caring for yourself after your procedure. Your doctor may also give you more specific instructions. Call your doctor if you have any problems or questions after your procedure. °HOME CARE °· Do not drive for 24 hours. °· Do not sign important papers or use machinery for 24 hours. °· You may shower. °· You may go back to your usual activities, but go slower for the first 24 hours. °· Take rest breaks often during the first 24 hours. °· Walk around or use warm packs on your belly (abdomen) if you have belly cramping or gas. °· Drink enough fluids to keep your pee (urine) clear or pale yellow. °· Resume your normal diet. Avoid heavy or fried foods. °· Avoid drinking alcohol for 24 hours or as told by your doctor. °· Only take medicines as told by your doctor. °If a tissue sample (biopsy) was taken during the procedure:  °· Do not take aspirin or blood thinners for 7 days, or as told by your doctor. °· Do not drink alcohol for 7 days, or as told by your doctor. °· Eat soft foods for the first 24 hours. °GET HELP IF: °You still have a small amount of blood in your poop (stool) 2-3 days after the procedure. °GET HELP RIGHT AWAY IF: °· You have more than a small amount of blood in your poop. °· You see clumps of tissue (blood clots) in your poop. °· Your belly is puffy (swollen). °· You feel sick to your stomach (nauseous) or throw up (vomit). °· You have a fever. °· You have belly pain that gets worse and medicine does not help. °MAKE SURE YOU: °· Understand these instructions. °· Will watch your condition. °· Will get help right away if you are not doing well or get worse. °Document Released: 08/30/2010 Document Revised: 08/02/2013 Document Reviewed: 04/04/2013 °ExitCare® Patient Information ©2015 [employer]. This information is not intended to replace advice given to you by your health care provider. Make sure you discuss any questions you have with  your health care provider. ° °

## 2015-01-02 NOTE — Transfer of Care (Signed)
Immediate Anesthesia Transfer of Care Note  Patient: Donald Moore  Procedure(s) Performed: Procedure(s): COLONOSCOPY WITH PROPOFOL (N/A)  Patient Location: PACU and Endoscopy Unit  Anesthesia Type:MAC  Level of Consciousness: awake, alert , oriented and patient cooperative  Airway & Oxygen Therapy: Patient Spontanous Breathing and Patient connected to face mask oxygen  Post-op Assessment: Report given to RN, Post -op Vital signs reviewed and stable and Patient moving all extremities  Post vital signs: Reviewed and stable  Last Vitals:  Filed Vitals:   01/02/15 0821  BP: 112/20  Pulse: 72  Resp: 20    Complications: No apparent anesthesia complications

## 2015-01-02 NOTE — H&P (Signed)
History of Present Illness: This is a referred by Donald Kraft, MD for the evaluation of colon cancer screening with a history of congestive heart failure. He is accompanied by his wife. Echocardiogram from August 2010 shows an estimated ejection fraction of 20-25% diffuse hypokinesis and a mildly dilated left atrium. Had ICD placed in 2014. Followed by [facility] Cardiology about every 6 months he relates subsequent echocardiograms have shown a similar ejection fraction. Denies SOB, DOE, weight loss, abdominal pain, constipation, diarrhea, change in stool caliber, melena, hematochezia, nausea, vomiting, dysphagia, reflux symptoms, chest pain.  No Known Allergies Outpatient Prescriptions Prior to Visit  Medication Sig Dispense Refill  . acetaminophen (TYLENOL) 500 MG tablet Take 500 mg by mouth every 6 (six) hours as needed.    Marland Kitchen aspirin 81 MG tablet Take 81 mg by mouth daily.    . carvedilol (COREG) 25 MG tablet 1/2 tablet by mouth twice daily    . citalopram (CELEXA) 20 MG tablet Take 20 mg by mouth daily.    . Liraglutide 18 MG/3ML SOPN Inject 16 mg into the skin daily.    . metFORMIN (GLUCOPHAGE-XR) 500 MG 24 hr tablet Take 500 mg by mouth daily with breakfast.    . ramipril (ALTACE) 5 MG capsule Take 5 mg by mouth 2 (two) times daily.    . rosuvastatin (CRESTOR) 20 MG tablet Take 20 mg by mouth daily.    . tamsulosin (FLOMAX) 0.4 MG CAPS capsule Take 0.4 mg by mouth daily.    . insulin glargine (LANTUS) 100 UNIT/ML injection Inject 10 Units into the skin 2 (two) times daily.    . Insulin Glargine (LANTUS) 100 UNIT/ML Solostar Pen Inject 20 Units into the skin 2 (two) times daily.     No facility-administered medications prior to visit.   Past Medical History  Diagnosis Date  . Diabetes mellitus   . CHF (congestive heart failure)   . Cancer of left kidney   . Hypertension   . Hyperlipemia   . Anxiety and  depression    Past Surgical History  Procedure Laterality Date  . Nephrectomy    . Implantable cardioverter defibrillator implant     History   Social History  . Marital Status: Married    Spouse Name: N/A  . Number of Children: 2  . Years of Education: N/A   Occupational History  . Truck Geophysicist/field seismologist    Social History Main Topics  . Smoking status: Former Research scientist (life sciences)  . Smokeless tobacco: Never Used  . Alcohol Use: No  . Drug Use: No  . Sexual Activity: Not on file   Other Topics Concern  . None   Social History Narrative   Family History  Problem Relation Age of Onset  . Prostate cancer Paternal Uncle   . Diabetes Maternal Grandmother   . Heart disease Father   . Heart disease Mother     Review of Systems: Pertinent positive and negative review of systems were noted in the above HPI section. All other review of systems were otherwise negative.   Physical Exam: General: Well developed, well nourished, no acute distress Head: Normocephalic and atraumatic Eyes: sclerae anicteric, EOMI Ears: Normal auditory acuity Mouth: No deformity or lesions Neck: Supple, no masses or thyromegaly Lungs: Clear throughout to auscultation Heart: Regular rate and rhythm; no murmurs, rubs or bruits Abdomen: Soft, non tender and non distended. No masses, hepatosplenomegaly or hernias noted. Normal Bowel sounds Rectal: deferred to colonoscopy Musculoskeletal: Symmetrical with no gross deformities  Skin: No lesions  on visible extremities Pulses: Normal pulses noted Extremities: No clubbing, cyanosis, edema or deformities noted Neurological: Alert oriented x 4, grossly nonfocal Cervical Nodes: No significant cervical adenopathy Inguinal Nodes: No significant inguinal adenopathy Psychological: Alert and cooperative. Normal mood and affect  Assessment and Recommendations:  1. Colorectal cancer screening,  average risk. Offered Cologuard and colonoscopy.The risks (including bleeding, perforation, infection, missed lesions, medication reactions and possible hospitalization or surgery if complications occur), benefits, and alternatives to colonoscopy with possible biopsy and possible polypectomy were discussed with the patient and they consent to proceed.   2. Congestive heart failure and cardiomyopathy S/P ICD placement. Elevated cardiopulmonary risk with sedation and procedures. ASA IV. Pt declined Cologuard.

## 2015-01-03 ENCOUNTER — Encounter: Payer: Self-pay | Admitting: Gastroenterology

## 2015-01-03 ENCOUNTER — Encounter (HOSPITAL_COMMUNITY): Payer: Self-pay | Admitting: Gastroenterology

## 2016-08-18 DIAGNOSIS — I428 Other cardiomyopathies: Secondary | ICD-10-CM | POA: Diagnosis not present

## 2016-08-18 DIAGNOSIS — I5022 Chronic systolic (congestive) heart failure: Secondary | ICD-10-CM | POA: Diagnosis not present

## 2016-08-28 DIAGNOSIS — C642 Malignant neoplasm of left kidney, except renal pelvis: Secondary | ICD-10-CM | POA: Diagnosis not present

## 2016-08-28 DIAGNOSIS — Z8553 Personal history of malignant neoplasm of renal pelvis: Secondary | ICD-10-CM | POA: Diagnosis not present

## 2016-09-02 ENCOUNTER — Ambulatory Visit (HOSPITAL_COMMUNITY)
Admission: RE | Admit: 2016-09-02 | Discharge: 2016-09-02 | Disposition: A | Discharge status: Home / Self Care | Payer: 59 | Source: Ambulatory Visit | Attending: Urology | Admitting: Urology

## 2016-09-02 ENCOUNTER — Other Ambulatory Visit (HOSPITAL_COMMUNITY): Payer: Self-pay | Admitting: Urology

## 2016-09-02 DIAGNOSIS — Z8553 Personal history of malignant neoplasm of renal pelvis: Secondary | ICD-10-CM

## 2016-09-02 DIAGNOSIS — I517 Cardiomegaly: Secondary | ICD-10-CM | POA: Insufficient documentation

## 2016-09-26 DIAGNOSIS — Z95810 Presence of automatic (implantable) cardiac defibrillator: Secondary | ICD-10-CM | POA: Diagnosis not present

## 2016-09-26 DIAGNOSIS — Z4502 Encounter for adjustment and management of automatic implantable cardiac defibrillator: Secondary | ICD-10-CM | POA: Diagnosis not present

## 2016-10-29 DIAGNOSIS — E789 Disorder of lipoprotein metabolism, unspecified: Secondary | ICD-10-CM | POA: Diagnosis not present

## 2016-10-29 DIAGNOSIS — E118 Type 2 diabetes mellitus with unspecified complications: Secondary | ICD-10-CM | POA: Diagnosis not present

## 2017-02-10 DIAGNOSIS — Z4502 Encounter for adjustment and management of automatic implantable cardiac defibrillator: Secondary | ICD-10-CM | POA: Diagnosis not present

## 2017-02-10 DIAGNOSIS — I130 Hypertensive heart and chronic kidney disease with heart failure and stage 1 through stage 4 chronic kidney disease, or unspecified chronic kidney disease: Secondary | ICD-10-CM | POA: Diagnosis not present

## 2017-02-10 DIAGNOSIS — E785 Hyperlipidemia, unspecified: Secondary | ICD-10-CM | POA: Diagnosis not present

## 2017-02-10 DIAGNOSIS — I428 Other cardiomyopathies: Secondary | ICD-10-CM | POA: Diagnosis not present

## 2017-02-10 DIAGNOSIS — I10 Essential (primary) hypertension: Secondary | ICD-10-CM | POA: Diagnosis not present

## 2017-02-10 DIAGNOSIS — I5022 Chronic systolic (congestive) heart failure: Secondary | ICD-10-CM | POA: Diagnosis not present

## 2017-02-10 DIAGNOSIS — Z95810 Presence of automatic (implantable) cardiac defibrillator: Secondary | ICD-10-CM | POA: Diagnosis not present

## 2017-04-08 DIAGNOSIS — G2581 Restless legs syndrome: Secondary | ICD-10-CM | POA: Diagnosis not present

## 2017-04-08 DIAGNOSIS — G4733 Obstructive sleep apnea (adult) (pediatric): Secondary | ICD-10-CM | POA: Diagnosis not present

## 2017-04-21 DIAGNOSIS — G4733 Obstructive sleep apnea (adult) (pediatric): Secondary | ICD-10-CM | POA: Diagnosis not present

## 2017-05-21 DIAGNOSIS — G4733 Obstructive sleep apnea (adult) (pediatric): Secondary | ICD-10-CM | POA: Diagnosis not present

## 2017-05-22 DIAGNOSIS — Z4502 Encounter for adjustment and management of automatic implantable cardiac defibrillator: Secondary | ICD-10-CM | POA: Diagnosis not present

## 2017-05-22 DIAGNOSIS — Z95810 Presence of automatic (implantable) cardiac defibrillator: Secondary | ICD-10-CM | POA: Diagnosis not present

## 2017-06-21 DIAGNOSIS — G4733 Obstructive sleep apnea (adult) (pediatric): Secondary | ICD-10-CM | POA: Diagnosis not present

## 2017-07-01 DIAGNOSIS — Z23 Encounter for immunization: Secondary | ICD-10-CM | POA: Diagnosis not present

## 2017-07-21 DIAGNOSIS — G4733 Obstructive sleep apnea (adult) (pediatric): Secondary | ICD-10-CM | POA: Diagnosis not present

## 2017-07-23 DIAGNOSIS — Z9989 Dependence on other enabling machines and devices: Secondary | ICD-10-CM | POA: Diagnosis not present

## 2017-07-23 DIAGNOSIS — G4733 Obstructive sleep apnea (adult) (pediatric): Secondary | ICD-10-CM | POA: Diagnosis not present

## 2017-08-14 DIAGNOSIS — E119 Type 2 diabetes mellitus without complications: Secondary | ICD-10-CM | POA: Diagnosis not present

## 2017-08-17 DIAGNOSIS — I5022 Chronic systolic (congestive) heart failure: Secondary | ICD-10-CM | POA: Diagnosis not present

## 2017-08-17 DIAGNOSIS — I428 Other cardiomyopathies: Secondary | ICD-10-CM | POA: Diagnosis not present

## 2017-08-18 DIAGNOSIS — Z125 Encounter for screening for malignant neoplasm of prostate: Secondary | ICD-10-CM | POA: Diagnosis not present

## 2017-08-18 DIAGNOSIS — E118 Type 2 diabetes mellitus with unspecified complications: Secondary | ICD-10-CM | POA: Diagnosis not present

## 2017-08-18 DIAGNOSIS — E789 Disorder of lipoprotein metabolism, unspecified: Secondary | ICD-10-CM | POA: Diagnosis not present

## 2017-08-25 DIAGNOSIS — Z9989 Dependence on other enabling machines and devices: Secondary | ICD-10-CM | POA: Diagnosis not present

## 2017-08-25 DIAGNOSIS — I10 Essential (primary) hypertension: Secondary | ICD-10-CM | POA: Diagnosis not present

## 2017-08-27 DIAGNOSIS — Z95810 Presence of automatic (implantable) cardiac defibrillator: Secondary | ICD-10-CM | POA: Diagnosis not present

## 2017-08-27 DIAGNOSIS — Z4502 Encounter for adjustment and management of automatic implantable cardiac defibrillator: Secondary | ICD-10-CM | POA: Diagnosis not present

## 2017-08-27 DIAGNOSIS — I5022 Chronic systolic (congestive) heart failure: Secondary | ICD-10-CM | POA: Diagnosis not present

## 2017-09-01 DIAGNOSIS — E119 Type 2 diabetes mellitus without complications: Secondary | ICD-10-CM | POA: Diagnosis not present

## 2017-09-01 DIAGNOSIS — E785 Hyperlipidemia, unspecified: Secondary | ICD-10-CM | POA: Diagnosis not present

## 2017-09-01 DIAGNOSIS — E781 Pure hyperglyceridemia: Secondary | ICD-10-CM | POA: Diagnosis not present

## 2017-09-09 DIAGNOSIS — C44319 Basal cell carcinoma of skin of other parts of face: Secondary | ICD-10-CM | POA: Diagnosis not present

## 2017-09-16 DIAGNOSIS — E119 Type 2 diabetes mellitus without complications: Secondary | ICD-10-CM | POA: Diagnosis not present

## 2017-09-16 DIAGNOSIS — E785 Hyperlipidemia, unspecified: Secondary | ICD-10-CM | POA: Diagnosis not present

## 2017-09-16 DIAGNOSIS — E781 Pure hyperglyceridemia: Secondary | ICD-10-CM | POA: Diagnosis not present

## 2017-09-17 DIAGNOSIS — E781 Pure hyperglyceridemia: Secondary | ICD-10-CM | POA: Diagnosis not present

## 2017-09-17 DIAGNOSIS — E118 Type 2 diabetes mellitus with unspecified complications: Secondary | ICD-10-CM | POA: Diagnosis not present

## 2017-09-28 DIAGNOSIS — I10 Essential (primary) hypertension: Secondary | ICD-10-CM | POA: Diagnosis not present

## 2017-09-28 DIAGNOSIS — E1121 Type 2 diabetes mellitus with diabetic nephropathy: Secondary | ICD-10-CM | POA: Diagnosis not present

## 2017-10-13 DIAGNOSIS — Z8553 Personal history of malignant neoplasm of renal pelvis: Secondary | ICD-10-CM | POA: Diagnosis not present

## 2017-10-14 DIAGNOSIS — Z85828 Personal history of other malignant neoplasm of skin: Secondary | ICD-10-CM | POA: Diagnosis not present

## 2017-10-14 DIAGNOSIS — M6740 Ganglion, unspecified site: Secondary | ICD-10-CM | POA: Diagnosis not present

## 2017-10-14 DIAGNOSIS — Z08 Encounter for follow-up examination after completed treatment for malignant neoplasm: Secondary | ICD-10-CM | POA: Diagnosis not present

## 2017-12-03 DIAGNOSIS — Z95810 Presence of automatic (implantable) cardiac defibrillator: Secondary | ICD-10-CM | POA: Diagnosis not present

## 2017-12-03 DIAGNOSIS — I5022 Chronic systolic (congestive) heart failure: Secondary | ICD-10-CM | POA: Diagnosis not present

## 2017-12-03 DIAGNOSIS — Z4502 Encounter for adjustment and management of automatic implantable cardiac defibrillator: Secondary | ICD-10-CM | POA: Diagnosis not present

## 2018-02-04 DIAGNOSIS — E118 Type 2 diabetes mellitus with unspecified complications: Secondary | ICD-10-CM | POA: Diagnosis not present

## 2018-02-15 DIAGNOSIS — E782 Mixed hyperlipidemia: Secondary | ICD-10-CM | POA: Diagnosis not present

## 2018-02-15 DIAGNOSIS — I5022 Chronic systolic (congestive) heart failure: Secondary | ICD-10-CM | POA: Diagnosis not present

## 2018-02-23 DIAGNOSIS — E781 Pure hyperglyceridemia: Secondary | ICD-10-CM | POA: Diagnosis not present

## 2018-02-23 DIAGNOSIS — E785 Hyperlipidemia, unspecified: Secondary | ICD-10-CM | POA: Diagnosis not present

## 2018-02-23 DIAGNOSIS — E119 Type 2 diabetes mellitus without complications: Secondary | ICD-10-CM | POA: Diagnosis not present

## 2018-03-11 DIAGNOSIS — Z95810 Presence of automatic (implantable) cardiac defibrillator: Secondary | ICD-10-CM | POA: Diagnosis not present

## 2018-03-11 DIAGNOSIS — Z4502 Encounter for adjustment and management of automatic implantable cardiac defibrillator: Secondary | ICD-10-CM | POA: Diagnosis not present

## 2018-04-20 DIAGNOSIS — E781 Pure hyperglyceridemia: Secondary | ICD-10-CM | POA: Diagnosis not present

## 2018-04-20 DIAGNOSIS — E118 Type 2 diabetes mellitus with unspecified complications: Secondary | ICD-10-CM | POA: Diagnosis not present

## 2018-05-11 DIAGNOSIS — E785 Hyperlipidemia, unspecified: Secondary | ICD-10-CM | POA: Diagnosis not present

## 2018-05-11 DIAGNOSIS — E119 Type 2 diabetes mellitus without complications: Secondary | ICD-10-CM | POA: Diagnosis not present

## 2018-05-11 DIAGNOSIS — E781 Pure hyperglyceridemia: Secondary | ICD-10-CM | POA: Diagnosis not present

## 2018-06-10 DIAGNOSIS — Z4502 Encounter for adjustment and management of automatic implantable cardiac defibrillator: Secondary | ICD-10-CM | POA: Diagnosis not present

## 2018-06-10 DIAGNOSIS — Z95810 Presence of automatic (implantable) cardiac defibrillator: Secondary | ICD-10-CM | POA: Diagnosis not present

## 2018-06-10 DIAGNOSIS — I5022 Chronic systolic (congestive) heart failure: Secondary | ICD-10-CM | POA: Diagnosis not present

## 2018-08-24 DIAGNOSIS — E119 Type 2 diabetes mellitus without complications: Secondary | ICD-10-CM | POA: Diagnosis not present

## 2018-08-24 DIAGNOSIS — Z125 Encounter for screening for malignant neoplasm of prostate: Secondary | ICD-10-CM | POA: Diagnosis not present

## 2018-08-24 DIAGNOSIS — E781 Pure hyperglyceridemia: Secondary | ICD-10-CM | POA: Diagnosis not present

## 2018-08-24 DIAGNOSIS — I10 Essential (primary) hypertension: Secondary | ICD-10-CM | POA: Diagnosis not present

## 2018-08-26 DIAGNOSIS — I10 Essential (primary) hypertension: Secondary | ICD-10-CM | POA: Diagnosis not present

## 2018-08-26 DIAGNOSIS — E785 Hyperlipidemia, unspecified: Secondary | ICD-10-CM | POA: Diagnosis not present

## 2018-08-26 DIAGNOSIS — E1121 Type 2 diabetes mellitus with diabetic nephropathy: Secondary | ICD-10-CM | POA: Diagnosis not present

## 2018-08-30 DIAGNOSIS — I10 Essential (primary) hypertension: Secondary | ICD-10-CM | POA: Diagnosis not present

## 2018-08-30 DIAGNOSIS — Z0000 Encounter for general adult medical examination without abnormal findings: Secondary | ICD-10-CM | POA: Diagnosis not present

## 2018-08-30 DIAGNOSIS — Z23 Encounter for immunization: Secondary | ICD-10-CM | POA: Diagnosis not present

## 2018-09-10 DIAGNOSIS — Z95810 Presence of automatic (implantable) cardiac defibrillator: Secondary | ICD-10-CM | POA: Diagnosis not present

## 2018-09-10 DIAGNOSIS — Z4502 Encounter for adjustment and management of automatic implantable cardiac defibrillator: Secondary | ICD-10-CM | POA: Diagnosis not present

## 2018-09-15 DIAGNOSIS — I5022 Chronic systolic (congestive) heart failure: Secondary | ICD-10-CM | POA: Diagnosis not present

## 2018-09-15 DIAGNOSIS — E785 Hyperlipidemia, unspecified: Secondary | ICD-10-CM | POA: Diagnosis not present

## 2018-09-15 DIAGNOSIS — I10 Essential (primary) hypertension: Secondary | ICD-10-CM | POA: Diagnosis not present

## 2018-12-08 DIAGNOSIS — Z8553 Personal history of malignant neoplasm of renal pelvis: Secondary | ICD-10-CM | POA: Diagnosis not present

## 2018-12-09 DIAGNOSIS — I5022 Chronic systolic (congestive) heart failure: Secondary | ICD-10-CM | POA: Diagnosis not present

## 2018-12-09 DIAGNOSIS — Z95810 Presence of automatic (implantable) cardiac defibrillator: Secondary | ICD-10-CM | POA: Diagnosis not present

## 2018-12-09 DIAGNOSIS — Z4502 Encounter for adjustment and management of automatic implantable cardiac defibrillator: Secondary | ICD-10-CM | POA: Diagnosis not present

## 2018-12-20 DIAGNOSIS — E119 Type 2 diabetes mellitus without complications: Secondary | ICD-10-CM | POA: Diagnosis not present

## 2019-04-15 ENCOUNTER — Other Ambulatory Visit: Payer: Self-pay | Admitting: Physician Assistant

## 2019-04-15 DIAGNOSIS — M25511 Pain in right shoulder: Secondary | ICD-10-CM

## 2019-04-21 ENCOUNTER — Other Ambulatory Visit: Payer: Self-pay | Admitting: Physician Assistant

## 2019-04-28 ENCOUNTER — Ambulatory Visit
Admission: RE | Admit: 2019-04-28 | Discharge: 2019-04-28 | Disposition: A | Discharge status: Home / Self Care | Payer: 59 | Source: Ambulatory Visit | Attending: Physician Assistant | Admitting: Physician Assistant

## 2019-04-28 DIAGNOSIS — M25511 Pain in right shoulder: Secondary | ICD-10-CM

## 2019-04-28 MED ORDER — IOPAMIDOL (ISOVUE-M 200) INJECTION 41%
13.0000 mL | Freq: Once | INTRAMUSCULAR | Status: AC
  Administered 2019-04-28: 13 mL via INTRA_ARTICULAR

## 2019-12-20 ENCOUNTER — Encounter: Payer: Self-pay | Admitting: Gastroenterology

## 2020-01-02 ENCOUNTER — Telehealth: Payer: Self-pay | Admitting: *Deleted

## 2020-01-02 NOTE — Telephone Encounter (Signed)
Please schedule office visit to assess, review colonoscopy risks, benefits.

## 2020-01-02 NOTE — Telephone Encounter (Signed)
Hi Dr. Fuller Plan,  You saw this patient in the office on 11/15/14 for a Colonoscopy Screen.  His echo EF was 20-25% back on 03/14/09.  You scheduled him for a colonoscopy at [facility] 01/02/15.  He had TA x 1 and Hyperplastic polyp x 1.  He is now on the schedule at [facility] for Colon Recall.  His last current echo on 05/05/19 at [facility] shows EF 20%. Do you need to see him for an OV or can he be direct to [facility] ?  Please advise.  Thank you,  Ileene Hutchinson, RN Pre-Visit

## 2020-01-25 ENCOUNTER — Encounter: Payer: 59 | Admitting: Gastroenterology

## 2020-03-08 ENCOUNTER — Ambulatory Visit: Payer: 59 | Admitting: Gastroenterology

## 2020-03-08 ENCOUNTER — Encounter: Payer: Self-pay | Admitting: Gastroenterology

## 2020-03-08 VITALS — BP 84/60 | HR 76 | Ht 72.0 in | Wt 271.0 lb

## 2020-03-08 DIAGNOSIS — Z86010 Personal history of colonic polyps: Secondary | ICD-10-CM

## 2020-03-08 DIAGNOSIS — I420 Dilated cardiomyopathy: Secondary | ICD-10-CM | POA: Diagnosis not present

## 2020-03-08 MED ORDER — SUTAB 1479-225-188 MG PO TABS: 1.0000 | ORAL_TABLET | ORAL | 0 refills | Status: AC

## 2020-03-08 NOTE — Progress Notes (Signed)
History of Present Illness: This is a 58 year old male referred by Anda Kraft, MD for the evaluation of personal history of adenomatous colon polyps.  He is accompanied by his wife.  He has no new gastrointestinal complaints.  He has a long history of having 2-3 loose bowel movements per day that generally follows meals.  This pattern has not changed.  Reviewed his recent cardiology notes. Denies weight loss, abdominal pain, constipation, change in stool caliber, melena, hematochezia, nausea, vomiting, dysphagia, reflux symptoms, chest pain.   No Known Allergies Outpatient Medications Prior to Visit  Medication Sig Dispense Refill  . acetaminophen (TYLENOL) 500 MG tablet Take 500 mg by mouth every 6 (six) hours as needed for moderate pain or headache.     Marland Kitchen aspirin 81 MG tablet Take 81 mg by mouth daily.    . carvedilol (COREG) 25 MG tablet Take 12.5 mg by mouth 2 (two) times daily.     . citalopram (CELEXA) 20 MG tablet Take 20 mg by mouth daily.    . furosemide (LASIX) 20 MG tablet Take 20 mg by mouth daily as needed for fluid.   1  . Insulin Glargine (LANTUS) 100 UNIT/ML Solostar Pen Inject 10 Units into the skin daily at 10 pm.    . Liraglutide 18 MG/3ML SOPN Inject 16 mg into the skin daily.    . metFORMIN (GLUCOPHAGE-XR) 500 MG 24 hr tablet Take 500 mg by mouth daily with breakfast.    . Na Sulfate-K Sulfate-Mg Sulf SOLN Take 1 kit by mouth once. 354 mL 0  . ramipril (ALTACE) 5 MG capsule Take 5 mg by mouth 2 (two) times daily.    . rosuvastatin (CRESTOR) 20 MG tablet Take 20 mg by mouth daily.    . tamsulosin (FLOMAX) 0.4 MG CAPS capsule Take 0.4 mg by mouth daily.     No facility-administered medications prior to visit.   Past Medical History:  Diagnosis Date  . AICD (automatic cardioverter/defibrillator) present    due to CHF  . Anxiety and depression   . Cancer of left kidney [facility])    cancer surgery left kidney removal, now cancer free with right kidney functioning well.   . CHF (congestive heart failure) ([place])   . Diabetes mellitus ([place])   . Hyperlipemia   . Hypertension   . Sleep apnea    cpap used nightly  . Tubular adenoma of colon 2016   Past Surgical History:  Procedure Laterality Date  . COLONOSCOPY WITH PROPOFOL N/A 01/02/2015   Procedure: COLONOSCOPY WITH PROPOFOL;  Surgeon: Ladene Artist, MD;  Location: [facility] ENDOSCOPY;  Service: Endoscopy;  Laterality: N/A;  . IMPLANTABLE CARDIOVERTER DEFIBRILLATOR IMPLANT    . NEPHRECTOMY Left    cancer- surgery only   Social History   Socioeconomic History  . Marital status: Married    Spouse name: Not on file  . Number of children: 2  . Years of education: Not on file  . Highest education level: Not on file  Occupational History  . Occupation: Truck Geophysicist/field seismologist  Tobacco Use  . Smoking status: Former Smoker    Types: Cigarettes    Quit date: 12/27/1992    Years since quitting: 27.2  . Smokeless tobacco: Never Used  Substance and Sexual Activity  . Alcohol use: No    Alcohol/week: 0.0 standard drinks  . Drug use: No  . Sexual activity: Not on file  Other Topics Concern  . Not on file  Social History Narrative  . Not  on file   Social Determinants of Health   Financial Resource Strain:   . Difficulty of Paying Living Expenses:   Food Insecurity:   . Worried About Charity fundraiser in the Last Year:   . Arboriculturist in the Last Year:   Transportation Needs:   . Film/video editor (Medical):   Marland Kitchen Lack of Transportation (Non-Medical):   Physical Activity:   . Days of Exercise per Week:   . Minutes of Exercise per Session:   Stress:   . Feeling of Stress :   Social Connections:   . Frequency of Communication with Friends and Family:   . Frequency of Social Gatherings with Friends and Family:   . Attends Religious Services:   . Active Member of Clubs or Organizations:   . Attends Archivist Meetings:   Marland Kitchen Marital Status:    Family History  Problem Relation Age of Onset    . Prostate cancer Paternal Uncle   . Diabetes Maternal Grandmother   . Heart disease Father   . Heart disease Mother   . Colon cancer Neg Hx   . Esophageal cancer Neg Hx   . Pancreatic cancer Neg Hx   . Stomach cancer Neg Hx   . Liver disease Neg Hx       Review of Systems: Pertinent positive and negative review of systems were noted in the above HPI section. All other review of systems were otherwise negative.   Physical Exam: General: Well developed, well nourished, no acute distress Head: Normocephalic and atraumatic Eyes:  sclerae anicteric, EOMI Ears: Normal auditory acuity Mouth: Not examined, mask on during Covid-19 pandemic Neck: Supple, no masses or thyromegaly Lungs: Clear throughout to auscultation Heart: Regular rate and rhythm; no murmurs, rubs or bruits Abdomen: Soft, non tender and non distended. No masses, hepatosplenomegaly or hernias noted. Normal Bowel sounds Rectal: Deferred to colonoscopy Musculoskeletal: Symmetrical with no gross deformities  Skin: No lesions on visible extremities Pulses:  Normal pulses noted Extremities: No clubbing, cyanosis, edema or deformities noted Neurological: Alert oriented x 4, grossly nonfocal Cervical Nodes:  No significant cervical adenopathy Inguinal Nodes: No significant inguinal adenopathy Psychological:  Alert and cooperative. Normal mood and affect   Assessment and Recommendations:  1. Personal history of adenomatous colon polyps.  He is due for surveillance colonoscopy.  Schedule colonoscopy. The risks (including bleeding, perforation, infection, missed lesions, medication reactions and possible hospitalization or surgery if complications occur), benefits, and alternatives to colonoscopy with possible biopsy and possible polypectomy were discussed with the patient and they consent to proceed.   2.  Cardiomyopathy. AICD. EF=20%. Stable.   3. DM.    cc: Anda Kraft, MD [address] [place],  [place] [address]

## 2020-03-08 NOTE — Patient Instructions (Signed)
You have been scheduled for a colonoscopy. Please follow written instructions given to you at your visit today.  Please pick up your prep supplies at the pharmacy within the next 1-3 days. If you use inhalers (even only as needed), please bring them with you on the day of your procedure.  Thank you for choosing me and [facility].  Malcolm T. Stark, Jr., MD., FACG  

## 2020-05-18 ENCOUNTER — Other Ambulatory Visit (HOSPITAL_COMMUNITY)
Admission: RE | Admit: 2020-05-18 | Discharge: 2020-05-18 | Disposition: A | Discharge status: Home / Self Care | Payer: 59 | Source: Ambulatory Visit | Attending: Gastroenterology | Admitting: Gastroenterology

## 2020-05-18 DIAGNOSIS — Z01812 Encounter for preprocedural laboratory examination: Secondary | ICD-10-CM | POA: Diagnosis present

## 2020-05-18 DIAGNOSIS — Z20822 Contact with and (suspected) exposure to covid-19: Secondary | ICD-10-CM | POA: Insufficient documentation

## 2020-05-18 LAB — SARS CORONAVIRUS 2 (TAT 6-24 HRS): SARS Coronavirus 2: NEGATIVE

## 2020-05-21 NOTE — Anesthesia Preprocedure Evaluation (Addendum)
Anesthesia Evaluation  Patient identified by MRN, date of birth, ID band Patient awake    Reviewed: Allergy & Precautions, NPO status , Patient's Chart, lab work & pertinent test results, reviewed documented beta blocker date and time   History of Anesthesia Complications Negative for: history of anesthetic complications  Airway Mallampati: III  TM Distance: >3 FB Neck ROM: Full    Dental no notable dental hx. (+) Dental Advisory Given   Pulmonary neg pulmonary ROS, sleep apnea and Continuous Positive Airway Pressure Ventilation , former smoker,    Pulmonary exam normal        Cardiovascular Exercise Tolerance: Good hypertension, Pt. on home beta blockers and Pt. on medications +CHF  Normal cardiovascular exam+ Cardiac Defibrillator   . Echo 05/05/19 showed drop in EF to 20%.   Neuro/Psych PSYCHIATRIC DISORDERS Anxiety Depression negative neurological ROS     GI/Hepatic negative GI ROS, Neg liver ROS,   Endo/Other  diabetes  Renal/GU negative Renal ROS     Musculoskeletal negative musculoskeletal ROS (+)   Abdominal   Peds  Hematology negative hematology ROS (+)   Anesthesia Other Findings   Reproductive/Obstetrics                            Anesthesia Physical Anesthesia Plan  ASA: III  Anesthesia Plan: MAC   Post-op Pain Management:    Induction:   PONV Risk Score and Plan: Ondansetron and Propofol infusion  Airway Management Planned: Natural Airway  Additional Equipment:   Intra-op Plan:   Post-operative Plan: Extubation in OR  Informed Consent: I have reviewed the patients History and Physical, chart, labs and discussed the procedure including the risks, benefits and alternatives for the proposed anesthesia with the patient or authorized representative who has indicated his/her understanding and acceptance.     Dental advisory given  Plan Discussed with:  Anesthesiologist and CRNA  Anesthesia Plan Comments:        Anesthesia Quick Evaluation

## 2020-05-22 ENCOUNTER — Encounter (HOSPITAL_COMMUNITY): Payer: Self-pay | Admitting: Gastroenterology

## 2020-05-22 ENCOUNTER — Ambulatory Visit (HOSPITAL_COMMUNITY): Payer: 59 | Admitting: Anesthesiology

## 2020-05-22 ENCOUNTER — Ambulatory Visit (HOSPITAL_COMMUNITY)
Admission: RE | Admit: 2020-05-22 | Discharge: 2020-05-22 | Disposition: A | Discharge status: Home / Self Care | Payer: 59 | Attending: Gastroenterology | Admitting: Gastroenterology

## 2020-05-22 ENCOUNTER — Encounter (HOSPITAL_COMMUNITY)
Admission: RE | Disposition: A | Discharge status: Home / Self Care | Payer: Self-pay | Source: Home / Self Care | Attending: Gastroenterology

## 2020-05-22 DIAGNOSIS — Z79899 Other long term (current) drug therapy: Secondary | ICD-10-CM | POA: Insufficient documentation

## 2020-05-22 DIAGNOSIS — D123 Benign neoplasm of transverse colon: Secondary | ICD-10-CM | POA: Diagnosis not present

## 2020-05-22 DIAGNOSIS — Z833 Family history of diabetes mellitus: Secondary | ICD-10-CM | POA: Diagnosis not present

## 2020-05-22 DIAGNOSIS — Z95810 Presence of automatic (implantable) cardiac defibrillator: Secondary | ICD-10-CM | POA: Insufficient documentation

## 2020-05-22 DIAGNOSIS — D124 Benign neoplasm of descending colon: Secondary | ICD-10-CM | POA: Diagnosis not present

## 2020-05-22 DIAGNOSIS — G4730 Sleep apnea, unspecified: Secondary | ICD-10-CM | POA: Diagnosis not present

## 2020-05-22 DIAGNOSIS — I420 Dilated cardiomyopathy: Secondary | ICD-10-CM

## 2020-05-22 DIAGNOSIS — Z1211 Encounter for screening for malignant neoplasm of colon: Secondary | ICD-10-CM | POA: Insufficient documentation

## 2020-05-22 DIAGNOSIS — I429 Cardiomyopathy, unspecified: Secondary | ICD-10-CM | POA: Diagnosis not present

## 2020-05-22 DIAGNOSIS — F419 Anxiety disorder, unspecified: Secondary | ICD-10-CM | POA: Insufficient documentation

## 2020-05-22 DIAGNOSIS — E785 Hyperlipidemia, unspecified: Secondary | ICD-10-CM | POA: Diagnosis not present

## 2020-05-22 DIAGNOSIS — Z794 Long term (current) use of insulin: Secondary | ICD-10-CM | POA: Insufficient documentation

## 2020-05-22 DIAGNOSIS — K640 First degree hemorrhoids: Secondary | ICD-10-CM | POA: Diagnosis not present

## 2020-05-22 DIAGNOSIS — Z7982 Long term (current) use of aspirin: Secondary | ICD-10-CM | POA: Insufficient documentation

## 2020-05-22 DIAGNOSIS — E119 Type 2 diabetes mellitus without complications: Secondary | ICD-10-CM | POA: Diagnosis not present

## 2020-05-22 DIAGNOSIS — Z86010 Personal history of colonic polyps: Secondary | ICD-10-CM

## 2020-05-22 DIAGNOSIS — I110 Hypertensive heart disease with heart failure: Secondary | ICD-10-CM | POA: Insufficient documentation

## 2020-05-22 DIAGNOSIS — Z905 Acquired absence of kidney: Secondary | ICD-10-CM | POA: Insufficient documentation

## 2020-05-22 DIAGNOSIS — Z8249 Family history of ischemic heart disease and other diseases of the circulatory system: Secondary | ICD-10-CM | POA: Insufficient documentation

## 2020-05-22 DIAGNOSIS — Z8042 Family history of malignant neoplasm of prostate: Secondary | ICD-10-CM | POA: Insufficient documentation

## 2020-05-22 DIAGNOSIS — F32A Depression, unspecified: Secondary | ICD-10-CM | POA: Diagnosis not present

## 2020-05-22 DIAGNOSIS — Z87891 Personal history of nicotine dependence: Secondary | ICD-10-CM | POA: Insufficient documentation

## 2020-05-22 DIAGNOSIS — Z85528 Personal history of other malignant neoplasm of kidney: Secondary | ICD-10-CM | POA: Insufficient documentation

## 2020-05-22 DIAGNOSIS — I509 Heart failure, unspecified: Secondary | ICD-10-CM | POA: Diagnosis not present

## 2020-05-22 HISTORY — PX: POLYPECTOMY: SHX5525

## 2020-05-22 HISTORY — PX: COLONOSCOPY WITH PROPOFOL: SHX5780

## 2020-05-22 LAB — POCT I-STAT, CHEM 8
BUN: 23 mg/dL (ref 8–23)
Calcium, Ion: 1.12 mmol/L — ABNORMAL LOW (ref 1.15–1.40)
Chloride: 106 mmol/L (ref 98–111)
Creatinine, Ser: 1.3 mg/dL — ABNORMAL HIGH (ref 0.61–1.24)
Glucose, Bld: 90 mg/dL (ref 70–99)
HCT: 51 % (ref 39.0–52.0)
Hemoglobin: 17.3 g/dL — ABNORMAL HIGH (ref 13.0–17.0)
Potassium: 5.2 mmol/L — ABNORMAL HIGH (ref 3.5–5.1)
Sodium: 140 mmol/L (ref 135–145)
TCO2: 24 mmol/L (ref 22–32)

## 2020-05-22 SURGERY — COLONOSCOPY WITH PROPOFOL
Anesthesia: Monitor Anesthesia Care

## 2020-05-22 MED ORDER — PHENYLEPHRINE HCL (PRESSORS) 10 MG/ML IV SOLN
INTRAVENOUS | Status: DC | PRN
  Administered 2020-05-22: 40 ug via INTRAVENOUS

## 2020-05-22 MED ORDER — PROPOFOL 500 MG/50ML IV EMUL
INTRAVENOUS | Status: DC | PRN
  Administered 2020-05-22: 150 ug/kg/min via INTRAVENOUS

## 2020-05-22 MED ORDER — SODIUM CHLORIDE 0.9 % IV SOLN: INTRAVENOUS | Status: DC

## 2020-05-22 SURGICAL SUPPLY — 21 items

## 2020-05-22 NOTE — Op Note (Addendum)
[facility] Patient Name: Donald Moore Procedure Date: 05/22/2020 MRN: [id number] Attending MD: Ladene Artist , MD Date of Birth: 1961-07-12 CSN: [id number] Age: 59 Admit Type: Outpatient Procedure:                Colonoscopy Indications:              Surveillance: Personal history of adenomatous                            polyps on last colonoscopy 5 years ago Providers:                Pricilla Riffle. Fuller Plan, MD, Cleda Daub, RN, Janee Morn, Technician Referring MD:             Anda Kraft, MD Medicines:                Monitored Anesthesia Care Complications:            No immediate complications. Estimated blood loss:                            None. Estimated Blood Loss:     Estimated blood loss: none. Procedure:                Pre-Anesthesia Assessment:                           - Prior to the procedure, a History and Physical                            was performed, and patient medications and                            allergies were reviewed. The patient's tolerance of                            previous anesthesia was also reviewed. The risks                            and benefits of the procedure and the sedation                            options and risks were discussed with the patient.                            All questions were answered, and informed consent                            was obtained. Prior Anticoagulants: The patient has                            taken no previous anticoagulant or antiplatelet                            agents.  ASA Grade Assessment: III - A patient with                            severe systemic disease. After reviewing the risks                            and benefits, the patient was deemed in                            satisfactory condition to undergo the procedure.                           After obtaining informed consent, the colonoscope                            was passed  under direct vision. Throughout the                            procedure, the patient's blood pressure, pulse, and                            oxygen saturations were monitored continuously. The                            CF-HQ190L ([id number] Olympus colonoscope was                            introduced through the anus and advanced to the the                            cecum, identified by appendiceal orifice and                            ileocecal valve. The ileocecal valve, appendiceal                            orifice, and rectum were photographed. The quality                            of the bowel preparation was excellent. The                            colonoscopy was performed without difficulty. The                            patient tolerated the procedure well. Scope In: 9:48:06 AM Scope Out: 10:07:27 AM Scope Withdrawal Time: 0 hours 16 minutes 44 seconds  Total Procedure Duration: 0 hours 19 minutes 21 seconds  Findings:      The perianal and digital rectal examinations were normal.      Five sessile polyps were found in the descending colon (1) and       transverse colon (4). The polyps were 6 to 7 mm in size. These polyps       were removed with a cold snare.  Resection and retrieval were complete.      Internal hemorrhoids were found during retroflexion. The hemorrhoids       were small and Grade I (internal hemorrhoids that do not prolapse).      The exam was otherwise without abnormality on direct and retroflexion       views. Impression:               - Five 6 to 7 mm polyps in the descending colon and                            in the transverse colon, removed with a cold snare.                            Resected and retrieved.                           - Internal hemorrhoids.                           - The examination was otherwise normal on direct                            and retroflexion views. Moderate Sedation:      Not Applicable - Patient had care per  Anesthesia. Recommendation:           - Repeat colonoscopy after studies are complete for                            surveillance based on pathology results, likely 3                            years.                           - Patient has a contact number available for                            emergencies. The signs and symptoms of potential                            delayed complications were discussed with the                            patient. Return to normal activities tomorrow.                            Written discharge instructions were provided to the                            patient.                           - Resume previous diet.                           - Continue present medications.                           -  Await pathology results. Procedure Code(s):        --- Professional ---                           [phone], Colonoscopy, flexible; with removal of                            tumor(s), polyp(s), or other lesion(s) by snare                            technique Diagnosis Code(s):        --- Professional ---                           Z86.010, Personal history of colonic polyps                           K63.5, Polyp of colon                           K64.0, First degree hemorrhoids CPT copyright 2019 [employer]. All rights reserved. The codes documented in this report are preliminary and upon coder review may  be revised to meet current compliance requirements. Ladene Artist, MD 05/22/2020 10:14:24 AM This report has been signed electronically. Number of Addenda: 0

## 2020-05-22 NOTE — H&P (Addendum)
History of Present Illness: This is a 59 year old male referred by Donald Kraft, MD for the evaluation of personal history of adenomatous colon polyps.  He has no new gastrointestinal complaints.  He has a long history of having 2-3 loose bowel movements per day that generally follows meals.  This pattern has not changed.  Reviewed his recent cardiology notes. Denies weight loss, abdominal pain, constipation, change in stool caliber, melena, hematochezia, nausea, vomiting, dysphagia, reflux symptoms, chest pain.   No Known Allergies       Outpatient Medications Prior to Visit  Medication Sig Dispense Refill  . acetaminophen (TYLENOL) 500 MG tablet Take 500 mg by mouth every 6 (six) hours as needed for moderate pain or headache.     Marland Kitchen aspirin 81 MG tablet Take 81 mg by mouth daily.    . carvedilol (COREG) 25 MG tablet Take 12.5 mg by mouth 2 (two) times daily.     . citalopram (CELEXA) 20 MG tablet Take 20 mg by mouth daily.    . furosemide (LASIX) 20 MG tablet Take 20 mg by mouth daily as needed for fluid.   1  . Insulin Glargine (LANTUS) 100 UNIT/ML Solostar Pen Inject 10 Units into the skin daily at 10 pm.    . Liraglutide 18 MG/3ML SOPN Inject 16 mg into the skin daily.    . metFORMIN (GLUCOPHAGE-XR) 500 MG 24 hr tablet Take 500 mg by mouth daily with breakfast.    . Na Sulfate-K Sulfate-Mg Sulf SOLN Take 1 kit by mouth once. 354 mL 0  . ramipril (ALTACE) 5 MG capsule Take 5 mg by mouth 2 (two) times daily.    . rosuvastatin (CRESTOR) 20 MG tablet Take 20 mg by mouth daily.    . tamsulosin (FLOMAX) 0.4 MG CAPS capsule Take 0.4 mg by mouth daily.     No facility-administered medications prior to visit.       Past Medical History:  Diagnosis Date  . AICD (automatic cardioverter/defibrillator) present    due to CHF  . Anxiety and depression   . Cancer of left kidney [facility])    cancer surgery left kidney removal, now cancer free with right kidney functioning  well.  . CHF (congestive heart failure) ([place])   . Diabetes mellitus ([place])   . Hyperlipemia   . Hypertension   . Sleep apnea    cpap used nightly  . Tubular adenoma of colon 2016        Past Surgical History:  Procedure Laterality Date  . COLONOSCOPY WITH PROPOFOL N/A 01/02/2015   Procedure: COLONOSCOPY WITH PROPOFOL;  Surgeon: Ladene Artist, MD;  Location: [facility] ENDOSCOPY;  Service: Endoscopy;  Laterality: N/A;  . IMPLANTABLE CARDIOVERTER DEFIBRILLATOR IMPLANT    . NEPHRECTOMY Left    cancer- surgery only   Social History        Socioeconomic History  . Marital status: Married    Spouse name: Not on file  . Number of children: 2  . Years of education: Not on file  . Highest education level: Not on file  Occupational History  . Occupation: Truck Geophysicist/field seismologist  Tobacco Use  . Smoking status: Former Smoker    Types: Cigarettes    Quit date: 12/27/1992    Years since quitting: 27.2  . Smokeless tobacco: Never Used  Substance and Sexual Activity  . Alcohol use: No    Alcohol/week: 0.0 standard drinks  . Drug use: No  . Sexual activity: Not on file  Other Topics Concern  .  Not on file  Social History Narrative  . Not on file   Social Determinants of Health      Financial Resource Strain:   . Difficulty of Paying Living Expenses:   Food Insecurity:   . Worried About Charity fundraiser in the Last Year:   . Arboriculturist in the Last Year:   Transportation Needs:   . Film/video editor (Medical):   Marland Kitchen Lack of Transportation (Non-Medical):   Physical Activity:   . Days of Exercise per Week:   . Minutes of Exercise per Session:   Stress:   . Feeling of Stress :   Social Connections:   . Frequency of Communication with Friends and Family:   . Frequency of Social Gatherings with Friends and Family:   . Attends Religious Services:   . Active Member of Clubs or Organizations:   . Attends Archivist Meetings:   Marland Kitchen Marital Status:          Family History  Problem Relation Age of Onset  . Prostate cancer Paternal Uncle   . Diabetes Maternal Grandmother   . Heart disease Father   . Heart disease Mother   . Colon cancer Neg Hx   . Esophageal cancer Neg Hx   . Pancreatic cancer Neg Hx   . Stomach cancer Neg Hx   . Liver disease Neg Hx       Review of Systems: Pertinent positive and negative review of systems were noted in the above HPI section. All other review of systems were otherwise negative.   Physical Exam: General: Well developed, well nourished, no acute distress Head: Normocephalic and atraumatic Eyes:  sclerae anicteric, EOMI Ears: Normal auditory acuity Mouth: Not examined, mask on during Covid-19 pandemic Neck: Supple, no masses or thyromegaly Lungs: Clear throughout to auscultation Heart: Regular rate and rhythm; no murmurs, rubs or bruits Abdomen: Soft, non tender and non distended. No masses, hepatosplenomegaly or hernias noted. Normal Bowel sounds Rectal: Deferred to colonoscopy Musculoskeletal: Symmetrical with no gross deformities  Skin: No lesions on visible extremities Pulses:  Normal pulses noted Extremities: No clubbing, cyanosis, edema or deformities noted Neurological: Alert oriented x 4, grossly nonfocal Cervical Nodes:  No significant cervical adenopathy Inguinal Nodes: No significant inguinal adenopathy Psychological:  Alert and cooperative. Normal mood and affect   Assessment and Recommendations:  1. Personal history of adenomatous colon polyps.  He is due for surveillance colonoscopy.  Schedule colonoscopy. The risks (including bleeding, perforation, infection, missed lesions, medication reactions and possible hospitalization or surgery if complications occur), benefits, and alternatives to colonoscopy with possible biopsy and possible polypectomy were discussed with the patient and they consent to proceed.   2.  Cardiomyopathy. S/P AICD. EF=20%.   3. DM.

## 2020-05-22 NOTE — Discharge Instructions (Signed)

## 2020-05-22 NOTE — Transfer of Care (Signed)
Immediate Anesthesia Transfer of Care Note  Patient: Donald Moore  Procedure(s) Performed: COLONOSCOPY WITH PROPOFOL (N/A ) POLYPECTOMY  Patient Location: PACU  Anesthesia Type:MAC  Level of Consciousness: sedated, patient cooperative and responds to stimulation  Airway & Oxygen Therapy: Patient Spontanous Breathing and Patient connected to face mask oxygen  Post-op Assessment: Report given to RN and Post -op Vital signs reviewed and stable  Post vital signs: Reviewed and stable  Last Vitals:  Vitals Value Taken Time  BP    Temp    Pulse 77 05/22/20 1012  Resp 16 05/22/20 1012  SpO2 100 % 05/22/20 1012  Vitals shown include unvalidated device data.  Last Pain:  Vitals:   05/22/20 0929  TempSrc: Oral         Complications: No complications documented.

## 2020-05-22 NOTE — Anesthesia Postprocedure Evaluation (Signed)
Anesthesia Post Note  Patient: Donald Moore  Procedure(s) Performed: COLONOSCOPY WITH PROPOFOL (N/A ) POLYPECTOMY     Patient location during evaluation: PACU Anesthesia Type: MAC Level of consciousness: awake and alert Pain management: pain level controlled Vital Signs Assessment: post-procedure vital signs reviewed and stable Respiratory status: spontaneous breathing and respiratory function stable Cardiovascular status: stable Postop Assessment: no apparent nausea or vomiting Anesthetic complications: no   No complications documented.  Last Vitals:  Vitals:   05/22/20 0929  BP: 118/79  Pulse: 68  Resp: 17  Temp: 36.7 C  SpO2: 100%    Last Pain:  Vitals:   05/22/20 0929  TempSrc: Oral                 Donald Moore

## 2020-05-23 ENCOUNTER — Encounter (HOSPITAL_COMMUNITY): Payer: Self-pay | Admitting: Gastroenterology

## 2020-05-23 LAB — SURGICAL PATHOLOGY

## 2020-05-24 ENCOUNTER — Encounter: Payer: Self-pay | Admitting: Gastroenterology

## 2021-04-03 DIAGNOSIS — E119 Type 2 diabetes mellitus without complications: Secondary | ICD-10-CM | POA: Diagnosis not present

## 2021-04-16 DIAGNOSIS — Z95810 Presence of automatic (implantable) cardiac defibrillator: Secondary | ICD-10-CM | POA: Diagnosis not present

## 2021-06-05 DIAGNOSIS — I428 Other cardiomyopathies: Secondary | ICD-10-CM | POA: Diagnosis not present

## 2021-06-05 DIAGNOSIS — Z23 Encounter for immunization: Secondary | ICD-10-CM | POA: Diagnosis not present

## 2021-06-05 DIAGNOSIS — E1121 Type 2 diabetes mellitus with diabetic nephropathy: Secondary | ICD-10-CM | POA: Diagnosis not present

## 2021-06-05 DIAGNOSIS — Z794 Long term (current) use of insulin: Secondary | ICD-10-CM | POA: Diagnosis not present

## 2021-06-05 DIAGNOSIS — E785 Hyperlipidemia, unspecified: Secondary | ICD-10-CM | POA: Diagnosis not present

## 2021-07-01 DIAGNOSIS — I5022 Chronic systolic (congestive) heart failure: Secondary | ICD-10-CM | POA: Diagnosis not present

## 2021-07-01 DIAGNOSIS — I10 Essential (primary) hypertension: Secondary | ICD-10-CM | POA: Diagnosis not present

## 2021-07-01 DIAGNOSIS — I428 Other cardiomyopathies: Secondary | ICD-10-CM | POA: Diagnosis not present

## 2021-07-01 DIAGNOSIS — E782 Mixed hyperlipidemia: Secondary | ICD-10-CM | POA: Diagnosis not present

## 2021-07-15 DIAGNOSIS — Z95810 Presence of automatic (implantable) cardiac defibrillator: Secondary | ICD-10-CM | POA: Diagnosis not present

## 2021-08-14 DIAGNOSIS — Z95810 Presence of automatic (implantable) cardiac defibrillator: Secondary | ICD-10-CM | POA: Diagnosis not present

## 2021-09-09 DIAGNOSIS — I10 Essential (primary) hypertension: Secondary | ICD-10-CM | POA: Diagnosis not present

## 2021-09-09 DIAGNOSIS — E118 Type 2 diabetes mellitus with unspecified complications: Secondary | ICD-10-CM | POA: Diagnosis not present

## 2021-09-09 DIAGNOSIS — E785 Hyperlipidemia, unspecified: Secondary | ICD-10-CM | POA: Diagnosis not present

## 2021-09-11 DIAGNOSIS — E1121 Type 2 diabetes mellitus with diabetic nephropathy: Secondary | ICD-10-CM | POA: Diagnosis not present

## 2021-09-11 DIAGNOSIS — E785 Hyperlipidemia, unspecified: Secondary | ICD-10-CM | POA: Diagnosis not present

## 2021-09-11 DIAGNOSIS — I428 Other cardiomyopathies: Secondary | ICD-10-CM | POA: Diagnosis not present

## 2021-09-11 DIAGNOSIS — I10 Essential (primary) hypertension: Secondary | ICD-10-CM | POA: Diagnosis not present

## 2021-10-08 DIAGNOSIS — Z95810 Presence of automatic (implantable) cardiac defibrillator: Secondary | ICD-10-CM | POA: Diagnosis not present

## 2021-10-08 DIAGNOSIS — I428 Other cardiomyopathies: Secondary | ICD-10-CM | POA: Diagnosis not present

## 2022-01-07 DIAGNOSIS — H9312 Tinnitus, left ear: Secondary | ICD-10-CM | POA: Diagnosis not present

## 2022-01-07 DIAGNOSIS — H6692 Otitis media, unspecified, left ear: Secondary | ICD-10-CM | POA: Diagnosis not present

## 2022-01-20 DIAGNOSIS — I5022 Chronic systolic (congestive) heart failure: Secondary | ICD-10-CM | POA: Diagnosis not present

## 2022-01-20 DIAGNOSIS — I428 Other cardiomyopathies: Secondary | ICD-10-CM | POA: Diagnosis not present

## 2022-02-07 DIAGNOSIS — Z8553 Personal history of malignant neoplasm of renal pelvis: Secondary | ICD-10-CM | POA: Diagnosis not present

## 2022-03-03 DIAGNOSIS — Z125 Encounter for screening for malignant neoplasm of prostate: Secondary | ICD-10-CM | POA: Diagnosis not present

## 2022-03-03 DIAGNOSIS — I10 Essential (primary) hypertension: Secondary | ICD-10-CM | POA: Diagnosis not present

## 2022-03-03 DIAGNOSIS — E118 Type 2 diabetes mellitus with unspecified complications: Secondary | ICD-10-CM | POA: Diagnosis not present

## 2022-03-03 DIAGNOSIS — E785 Hyperlipidemia, unspecified: Secondary | ICD-10-CM | POA: Diagnosis not present

## 2022-03-11 DIAGNOSIS — G4733 Obstructive sleep apnea (adult) (pediatric): Secondary | ICD-10-CM | POA: Diagnosis not present

## 2022-03-11 DIAGNOSIS — Z0001 Encounter for general adult medical examination with abnormal findings: Secondary | ICD-10-CM | POA: Diagnosis not present

## 2022-03-11 DIAGNOSIS — E1121 Type 2 diabetes mellitus with diabetic nephropathy: Secondary | ICD-10-CM | POA: Diagnosis not present

## 2022-03-11 DIAGNOSIS — I10 Essential (primary) hypertension: Secondary | ICD-10-CM | POA: Diagnosis not present

## 2022-04-15 DIAGNOSIS — Z95810 Presence of automatic (implantable) cardiac defibrillator: Secondary | ICD-10-CM | POA: Diagnosis not present

## 2022-06-24 DIAGNOSIS — I10 Essential (primary) hypertension: Secondary | ICD-10-CM | POA: Diagnosis not present

## 2022-06-24 DIAGNOSIS — I428 Other cardiomyopathies: Secondary | ICD-10-CM | POA: Diagnosis not present

## 2022-06-24 DIAGNOSIS — E1121 Type 2 diabetes mellitus with diabetic nephropathy: Secondary | ICD-10-CM | POA: Diagnosis not present

## 2022-06-24 DIAGNOSIS — E118 Type 2 diabetes mellitus with unspecified complications: Secondary | ICD-10-CM | POA: Diagnosis not present

## 2022-07-14 DIAGNOSIS — Z95810 Presence of automatic (implantable) cardiac defibrillator: Secondary | ICD-10-CM | POA: Diagnosis not present

## 2022-07-15 DIAGNOSIS — Z95810 Presence of automatic (implantable) cardiac defibrillator: Secondary | ICD-10-CM | POA: Diagnosis not present

## 2022-07-16 DIAGNOSIS — E119 Type 2 diabetes mellitus without complications: Secondary | ICD-10-CM | POA: Diagnosis not present

## 2022-07-21 DIAGNOSIS — E782 Mixed hyperlipidemia: Secondary | ICD-10-CM | POA: Diagnosis not present

## 2022-07-21 DIAGNOSIS — I428 Other cardiomyopathies: Secondary | ICD-10-CM | POA: Diagnosis not present

## 2022-07-21 DIAGNOSIS — I5022 Chronic systolic (congestive) heart failure: Secondary | ICD-10-CM | POA: Diagnosis not present

## 2022-09-11 DIAGNOSIS — E785 Hyperlipidemia, unspecified: Secondary | ICD-10-CM | POA: Diagnosis not present

## 2022-09-11 DIAGNOSIS — I10 Essential (primary) hypertension: Secondary | ICD-10-CM | POA: Diagnosis not present

## 2022-09-11 DIAGNOSIS — E1121 Type 2 diabetes mellitus with diabetic nephropathy: Secondary | ICD-10-CM | POA: Diagnosis not present

## 2022-09-18 DIAGNOSIS — I10 Essential (primary) hypertension: Secondary | ICD-10-CM | POA: Diagnosis not present

## 2022-09-18 DIAGNOSIS — E1121 Type 2 diabetes mellitus with diabetic nephropathy: Secondary | ICD-10-CM | POA: Diagnosis not present

## 2022-09-18 DIAGNOSIS — I428 Other cardiomyopathies: Secondary | ICD-10-CM | POA: Diagnosis not present

## 2022-10-09 DIAGNOSIS — Z95810 Presence of automatic (implantable) cardiac defibrillator: Secondary | ICD-10-CM | POA: Diagnosis not present

## 2022-10-13 DIAGNOSIS — Z95810 Presence of automatic (implantable) cardiac defibrillator: Secondary | ICD-10-CM | POA: Diagnosis not present

## 2022-10-15 DIAGNOSIS — Z95810 Presence of automatic (implantable) cardiac defibrillator: Secondary | ICD-10-CM | POA: Diagnosis not present

## 2022-12-18 DIAGNOSIS — I10 Essential (primary) hypertension: Secondary | ICD-10-CM | POA: Diagnosis not present

## 2022-12-18 DIAGNOSIS — I428 Other cardiomyopathies: Secondary | ICD-10-CM | POA: Diagnosis not present

## 2022-12-18 DIAGNOSIS — E1121 Type 2 diabetes mellitus with diabetic nephropathy: Secondary | ICD-10-CM | POA: Diagnosis not present

## 2023-01-08 DIAGNOSIS — I130 Hypertensive heart and chronic kidney disease with heart failure and stage 1 through stage 4 chronic kidney disease, or unspecified chronic kidney disease: Secondary | ICD-10-CM | POA: Diagnosis not present

## 2023-01-08 DIAGNOSIS — N189 Chronic kidney disease, unspecified: Secondary | ICD-10-CM | POA: Diagnosis not present

## 2023-01-08 DIAGNOSIS — G4761 Periodic limb movement disorder: Secondary | ICD-10-CM | POA: Diagnosis not present

## 2023-01-08 DIAGNOSIS — Z79899 Other long term (current) drug therapy: Secondary | ICD-10-CM | POA: Diagnosis not present

## 2023-01-08 DIAGNOSIS — Z87891 Personal history of nicotine dependence: Secondary | ICD-10-CM | POA: Diagnosis not present

## 2023-01-08 DIAGNOSIS — E1122 Type 2 diabetes mellitus with diabetic chronic kidney disease: Secondary | ICD-10-CM | POA: Diagnosis not present

## 2023-01-08 DIAGNOSIS — I10 Essential (primary) hypertension: Secondary | ICD-10-CM | POA: Diagnosis not present

## 2023-01-08 DIAGNOSIS — G4733 Obstructive sleep apnea (adult) (pediatric): Secondary | ICD-10-CM | POA: Diagnosis not present

## 2023-01-08 DIAGNOSIS — I5022 Chronic systolic (congestive) heart failure: Secondary | ICD-10-CM | POA: Diagnosis not present

## 2023-01-12 DIAGNOSIS — Z95810 Presence of automatic (implantable) cardiac defibrillator: Secondary | ICD-10-CM | POA: Diagnosis not present

## 2023-01-16 DIAGNOSIS — Z4502 Encounter for adjustment and management of automatic implantable cardiac defibrillator: Secondary | ICD-10-CM | POA: Diagnosis not present

## 2023-01-16 DIAGNOSIS — I10 Essential (primary) hypertension: Secondary | ICD-10-CM | POA: Diagnosis not present

## 2023-01-16 DIAGNOSIS — I5022 Chronic systolic (congestive) heart failure: Secondary | ICD-10-CM | POA: Diagnosis not present

## 2023-01-16 DIAGNOSIS — Z95810 Presence of automatic (implantable) cardiac defibrillator: Secondary | ICD-10-CM | POA: Diagnosis not present

## 2023-01-23 DIAGNOSIS — T888XXA Other specified complications of surgical and medical care, not elsewhere classified, initial encounter: Secondary | ICD-10-CM | POA: Diagnosis not present

## 2023-01-23 DIAGNOSIS — G4733 Obstructive sleep apnea (adult) (pediatric): Secondary | ICD-10-CM | POA: Diagnosis not present

## 2023-02-20 DIAGNOSIS — R8289 Other abnormal findings on cytological and histological examination of urine: Secondary | ICD-10-CM | POA: Diagnosis not present

## 2023-02-20 DIAGNOSIS — Z8553 Personal history of malignant neoplasm of renal pelvis: Secondary | ICD-10-CM | POA: Diagnosis not present

## 2023-02-22 DIAGNOSIS — G4733 Obstructive sleep apnea (adult) (pediatric): Secondary | ICD-10-CM | POA: Diagnosis not present

## 2023-02-22 DIAGNOSIS — T888XXA Other specified complications of surgical and medical care, not elsewhere classified, initial encounter: Secondary | ICD-10-CM | POA: Diagnosis not present

## 2023-03-12 DIAGNOSIS — Z0000 Encounter for general adult medical examination without abnormal findings: Secondary | ICD-10-CM | POA: Diagnosis not present

## 2023-03-12 DIAGNOSIS — Z125 Encounter for screening for malignant neoplasm of prostate: Secondary | ICD-10-CM | POA: Diagnosis not present

## 2023-03-18 DIAGNOSIS — E1121 Type 2 diabetes mellitus with diabetic nephropathy: Secondary | ICD-10-CM | POA: Diagnosis not present

## 2023-03-18 DIAGNOSIS — F411 Generalized anxiety disorder: Secondary | ICD-10-CM | POA: Diagnosis not present

## 2023-03-18 DIAGNOSIS — Z0000 Encounter for general adult medical examination without abnormal findings: Secondary | ICD-10-CM | POA: Diagnosis not present

## 2023-03-18 DIAGNOSIS — G4733 Obstructive sleep apnea (adult) (pediatric): Secondary | ICD-10-CM | POA: Diagnosis not present

## 2023-03-18 DIAGNOSIS — I10 Essential (primary) hypertension: Secondary | ICD-10-CM | POA: Diagnosis not present

## 2023-03-25 DIAGNOSIS — T888XXA Other specified complications of surgical and medical care, not elsewhere classified, initial encounter: Secondary | ICD-10-CM | POA: Diagnosis not present

## 2023-03-25 DIAGNOSIS — G4733 Obstructive sleep apnea (adult) (pediatric): Secondary | ICD-10-CM | POA: Diagnosis not present

## 2023-04-20 DIAGNOSIS — I428 Other cardiomyopathies: Secondary | ICD-10-CM | POA: Diagnosis not present

## 2023-04-20 DIAGNOSIS — I5022 Chronic systolic (congestive) heart failure: Secondary | ICD-10-CM | POA: Diagnosis not present

## 2023-04-22 DIAGNOSIS — Z4502 Encounter for adjustment and management of automatic implantable cardiac defibrillator: Secondary | ICD-10-CM | POA: Diagnosis not present

## 2023-04-22 DIAGNOSIS — I10 Essential (primary) hypertension: Secondary | ICD-10-CM | POA: Diagnosis not present

## 2023-04-22 DIAGNOSIS — I5022 Chronic systolic (congestive) heart failure: Secondary | ICD-10-CM | POA: Diagnosis not present

## 2023-04-22 DIAGNOSIS — Z95810 Presence of automatic (implantable) cardiac defibrillator: Secondary | ICD-10-CM | POA: Diagnosis not present

## 2023-04-23 DIAGNOSIS — Z95810 Presence of automatic (implantable) cardiac defibrillator: Secondary | ICD-10-CM | POA: Diagnosis not present

## 2023-04-23 DIAGNOSIS — G4761 Periodic limb movement disorder: Secondary | ICD-10-CM | POA: Diagnosis not present

## 2023-04-23 DIAGNOSIS — G4733 Obstructive sleep apnea (adult) (pediatric): Secondary | ICD-10-CM | POA: Diagnosis not present

## 2023-04-25 DIAGNOSIS — T888XXA Other specified complications of surgical and medical care, not elsewhere classified, initial encounter: Secondary | ICD-10-CM | POA: Diagnosis not present

## 2023-04-25 DIAGNOSIS — G4733 Obstructive sleep apnea (adult) (pediatric): Secondary | ICD-10-CM | POA: Diagnosis not present

## 2023-05-25 DIAGNOSIS — T888XXA Other specified complications of surgical and medical care, not elsewhere classified, initial encounter: Secondary | ICD-10-CM | POA: Diagnosis not present

## 2023-05-25 DIAGNOSIS — G4733 Obstructive sleep apnea (adult) (pediatric): Secondary | ICD-10-CM | POA: Diagnosis not present

## 2023-06-25 DIAGNOSIS — G4733 Obstructive sleep apnea (adult) (pediatric): Secondary | ICD-10-CM | POA: Diagnosis not present

## 2023-06-25 DIAGNOSIS — T888XXA Other specified complications of surgical and medical care, not elsewhere classified, initial encounter: Secondary | ICD-10-CM | POA: Diagnosis not present

## 2023-07-25 DIAGNOSIS — T888XXA Other specified complications of surgical and medical care, not elsewhere classified, initial encounter: Secondary | ICD-10-CM | POA: Diagnosis not present

## 2023-07-25 DIAGNOSIS — G4733 Obstructive sleep apnea (adult) (pediatric): Secondary | ICD-10-CM | POA: Diagnosis not present

## 2023-07-27 DIAGNOSIS — I5022 Chronic systolic (congestive) heart failure: Secondary | ICD-10-CM | POA: Diagnosis not present

## 2023-07-27 DIAGNOSIS — G4733 Obstructive sleep apnea (adult) (pediatric): Secondary | ICD-10-CM | POA: Diagnosis not present

## 2023-07-27 DIAGNOSIS — I10 Essential (primary) hypertension: Secondary | ICD-10-CM | POA: Diagnosis not present

## 2023-07-27 DIAGNOSIS — Z95810 Presence of automatic (implantable) cardiac defibrillator: Secondary | ICD-10-CM | POA: Diagnosis not present

## 2023-08-25 DIAGNOSIS — G4733 Obstructive sleep apnea (adult) (pediatric): Secondary | ICD-10-CM | POA: Diagnosis not present

## 2023-08-25 DIAGNOSIS — T888XXA Other specified complications of surgical and medical care, not elsewhere classified, initial encounter: Secondary | ICD-10-CM | POA: Diagnosis not present

## 2023-09-01 DIAGNOSIS — E1121 Type 2 diabetes mellitus with diabetic nephropathy: Secondary | ICD-10-CM | POA: Diagnosis not present

## 2023-09-01 DIAGNOSIS — M159 Polyosteoarthritis, unspecified: Secondary | ICD-10-CM | POA: Diagnosis not present

## 2023-09-01 DIAGNOSIS — Z23 Encounter for immunization: Secondary | ICD-10-CM | POA: Diagnosis not present

## 2023-09-01 DIAGNOSIS — E785 Hyperlipidemia, unspecified: Secondary | ICD-10-CM | POA: Diagnosis not present

## 2023-09-01 DIAGNOSIS — F411 Generalized anxiety disorder: Secondary | ICD-10-CM | POA: Diagnosis not present

## 2023-09-24 DIAGNOSIS — R0602 Shortness of breath: Secondary | ICD-10-CM | POA: Diagnosis not present

## 2023-09-24 DIAGNOSIS — I3139 Other pericardial effusion (noninflammatory): Secondary | ICD-10-CM | POA: Diagnosis not present

## 2023-09-24 DIAGNOSIS — I428 Other cardiomyopathies: Secondary | ICD-10-CM | POA: Diagnosis not present

## 2023-09-24 DIAGNOSIS — I340 Nonrheumatic mitral (valve) insufficiency: Secondary | ICD-10-CM | POA: Diagnosis not present

## 2023-09-24 DIAGNOSIS — Z95810 Presence of automatic (implantable) cardiac defibrillator: Secondary | ICD-10-CM | POA: Diagnosis not present

## 2023-09-24 DIAGNOSIS — I5022 Chronic systolic (congestive) heart failure: Secondary | ICD-10-CM | POA: Diagnosis not present

## 2023-09-25 DIAGNOSIS — T888XXA Other specified complications of surgical and medical care, not elsewhere classified, initial encounter: Secondary | ICD-10-CM | POA: Diagnosis not present

## 2023-09-25 DIAGNOSIS — G4733 Obstructive sleep apnea (adult) (pediatric): Secondary | ICD-10-CM | POA: Diagnosis not present

## 2023-10-19 DIAGNOSIS — I428 Other cardiomyopathies: Secondary | ICD-10-CM | POA: Diagnosis not present

## 2023-10-19 DIAGNOSIS — R0602 Shortness of breath: Secondary | ICD-10-CM | POA: Diagnosis not present

## 2023-10-19 DIAGNOSIS — I5022 Chronic systolic (congestive) heart failure: Secondary | ICD-10-CM | POA: Diagnosis not present

## 2023-10-23 DIAGNOSIS — T888XXA Other specified complications of surgical and medical care, not elsewhere classified, initial encounter: Secondary | ICD-10-CM | POA: Diagnosis not present

## 2023-10-27 DIAGNOSIS — G4733 Obstructive sleep apnea (adult) (pediatric): Secondary | ICD-10-CM | POA: Diagnosis not present

## 2023-10-27 DIAGNOSIS — I5022 Chronic systolic (congestive) heart failure: Secondary | ICD-10-CM | POA: Diagnosis not present

## 2023-10-27 DIAGNOSIS — Z95810 Presence of automatic (implantable) cardiac defibrillator: Secondary | ICD-10-CM | POA: Diagnosis not present

## 2023-10-27 DIAGNOSIS — I10 Essential (primary) hypertension: Secondary | ICD-10-CM | POA: Diagnosis not present

## 2023-10-28 DIAGNOSIS — Z95810 Presence of automatic (implantable) cardiac defibrillator: Secondary | ICD-10-CM | POA: Diagnosis not present

## 2023-10-28 DIAGNOSIS — I493 Ventricular premature depolarization: Secondary | ICD-10-CM | POA: Diagnosis not present

## 2023-10-28 DIAGNOSIS — I10 Essential (primary) hypertension: Secondary | ICD-10-CM | POA: Diagnosis not present

## 2023-10-28 DIAGNOSIS — I5022 Chronic systolic (congestive) heart failure: Secondary | ICD-10-CM | POA: Diagnosis not present

## 2023-10-28 DIAGNOSIS — G4733 Obstructive sleep apnea (adult) (pediatric): Secondary | ICD-10-CM | POA: Diagnosis not present

## 2023-12-05 DIAGNOSIS — G4733 Obstructive sleep apnea (adult) (pediatric): Secondary | ICD-10-CM | POA: Diagnosis not present

## 2024-01-05 ENCOUNTER — Other Ambulatory Visit (HOSPITAL_COMMUNITY): Payer: Self-pay

## 2024-01-05 DIAGNOSIS — E1121 Type 2 diabetes mellitus with diabetic nephropathy: Secondary | ICD-10-CM | POA: Diagnosis not present

## 2024-01-05 DIAGNOSIS — I428 Other cardiomyopathies: Secondary | ICD-10-CM | POA: Diagnosis not present

## 2024-01-05 DIAGNOSIS — E785 Hyperlipidemia, unspecified: Secondary | ICD-10-CM | POA: Diagnosis not present

## 2024-01-05 DIAGNOSIS — I10 Essential (primary) hypertension: Secondary | ICD-10-CM | POA: Diagnosis not present

## 2024-01-05 MED ORDER — MOUNJARO 15 MG/0.5ML ~~LOC~~ SOAJ
15.0000 mg | SUBCUTANEOUS | 3 refills | Status: AC
  Filled 2024-01-05 – 2024-02-08 (×2): qty 6, 84d supply, fill #0
  Filled 2024-04-27 (×2): qty 6, 84d supply, fill #1

## 2024-01-08 ENCOUNTER — Other Ambulatory Visit (HOSPITAL_COMMUNITY): Payer: Self-pay

## 2024-02-01 DIAGNOSIS — I5022 Chronic systolic (congestive) heart failure: Secondary | ICD-10-CM | POA: Diagnosis not present

## 2024-02-01 DIAGNOSIS — I493 Ventricular premature depolarization: Secondary | ICD-10-CM | POA: Diagnosis not present

## 2024-02-01 DIAGNOSIS — I10 Essential (primary) hypertension: Secondary | ICD-10-CM | POA: Diagnosis not present

## 2024-02-01 DIAGNOSIS — E785 Hyperlipidemia, unspecified: Secondary | ICD-10-CM | POA: Diagnosis not present

## 2024-02-01 DIAGNOSIS — Z4502 Encounter for adjustment and management of automatic implantable cardiac defibrillator: Secondary | ICD-10-CM | POA: Diagnosis not present

## 2024-02-01 DIAGNOSIS — G4733 Obstructive sleep apnea (adult) (pediatric): Secondary | ICD-10-CM | POA: Diagnosis not present

## 2024-02-01 DIAGNOSIS — Z95810 Presence of automatic (implantable) cardiac defibrillator: Secondary | ICD-10-CM | POA: Diagnosis not present

## 2024-02-01 DIAGNOSIS — C649 Malignant neoplasm of unspecified kidney, except renal pelvis: Secondary | ICD-10-CM | POA: Diagnosis not present

## 2024-02-08 ENCOUNTER — Other Ambulatory Visit: Payer: Self-pay

## 2024-02-08 ENCOUNTER — Other Ambulatory Visit (HOSPITAL_COMMUNITY): Payer: Self-pay

## 2024-02-24 DIAGNOSIS — R8271 Bacteriuria: Secondary | ICD-10-CM | POA: Diagnosis not present

## 2024-02-24 DIAGNOSIS — R8289 Other abnormal findings on cytological and histological examination of urine: Secondary | ICD-10-CM | POA: Diagnosis not present

## 2024-02-24 DIAGNOSIS — Z8553 Personal history of malignant neoplasm of renal pelvis: Secondary | ICD-10-CM | POA: Diagnosis not present

## 2024-02-25 DIAGNOSIS — N179 Acute kidney failure, unspecified: Secondary | ICD-10-CM | POA: Diagnosis not present

## 2024-02-25 DIAGNOSIS — I4510 Unspecified right bundle-branch block: Secondary | ICD-10-CM | POA: Diagnosis not present

## 2024-02-25 DIAGNOSIS — Z79899 Other long term (current) drug therapy: Secondary | ICD-10-CM | POA: Diagnosis not present

## 2024-02-25 NOTE — Telephone Encounter (Signed)
 The patient is on his way to the local ED as advised, his wife is driving. Advised he will need labs and perhaps IV fluids as he thinks he is dehydrated. I will forward the message to Dr Louanna PA to review. I told the patient that since he has lost so much weight, some of his med doses may be to adjusted, but don't expect the ED to do it. I told him I would follow up with him tomorrow to get an update and discuss his medications. He was good with the plan

## 2024-02-25 NOTE — Telephone Encounter (Signed)
 Reason for Disposition . Systolic BP < 80 and NOT dizzy, lightheaded or weak (feels normal) . Systolic BP < 90 and NOT dizzy, lightheaded or weak . Systolic BP 90-110 while taking blood pressure medications and NOT dizzy, lightheaded or weak . Fall in systolic BP > 20 mm Hg from normal and NOT dizzy, lightheaded, or weak . Fall in systolic BP > 20 mmHg after standing up . Brief dizziness or lightheadedness after standing up or eating . Wants doctor to measure BP  Additional Information . Negative: Started suddenly after an allergic medicine, an allergic food, or bee sting . Negative: Shock suspected (e.g., cold/pale/clammy skin, too weak to stand, low BP, rapid pulse) . Negative: Difficult to awaken or acting confused  (e.g., disoriented, slurred speech) . Negative: Fainted . Negative: Chest pain . Negative: Bleeding (e.g., vomiting blood, rectal bleeding or tarry stools, severe vaginal bleeding) . Negative: Extra heart beats or heart is beating fast  (i.e., palpitations) . Negative: Sounds like a life-threatening emergency to the triager . Negative: Systolic BP < 90 and feeling dizzy, lightheaded, or weak    When he bends over he gets swimmy headed, he has lost a lot of weight 58 lbs less than a year. When he is walking pt states he is fine . Negative: Abdominal pain . Negative: Major surgery in the past month . Negative: Fever > 100.5 F (38.1 C) . Negative: Drinking very little and has signs of dehydration (e.g., no urine > 12 hours, very dry mouth, very lightheaded)    No chest pain, no pain at all, no SOB. Patient last week did have an episode where he was very sweaty . Negative: Fall in systolic BP > 20 mm Hg from normal and feeling dizzy, lightheaded, or weak . Negative: Patient sounds very sick or weak to the triager . Negative: Fall in systolic BP > 20 mmHg after eating a meal . Negative: Diastolic BP < 50 mm Hg  Protocols used: Low Blood Pressure-A-OH Patient called  stating his BP for the past week has been very low, at the time of this call he states it was 69/53 HR 78. Patient states he is not dizzy unless he bends over. When he stands up he has a brief dizziness until it settles because he is bending over before he stands. Yesterday it was 87/65 HR 77. Patient states he has no pain, and feels fine until he bends over. He is taking BP medication. States he lost a lot of weight in less than a year (58 lbs). Per protocol asked him to go to ED, also stated he should not drive himself and he said his wife will drive him. He would like his  team to still give him a call at [phone].

## 2024-03-01 DIAGNOSIS — E1121 Type 2 diabetes mellitus with diabetic nephropathy: Secondary | ICD-10-CM | POA: Diagnosis not present

## 2024-03-01 DIAGNOSIS — I951 Orthostatic hypotension: Secondary | ICD-10-CM | POA: Diagnosis not present

## 2024-03-01 DIAGNOSIS — I5022 Chronic systolic (congestive) heart failure: Secondary | ICD-10-CM | POA: Diagnosis not present

## 2024-03-01 DIAGNOSIS — N1831 Chronic kidney disease, stage 3a: Secondary | ICD-10-CM | POA: Diagnosis not present

## 2024-03-07 DIAGNOSIS — N1831 Chronic kidney disease, stage 3a: Secondary | ICD-10-CM | POA: Diagnosis not present

## 2024-03-07 DIAGNOSIS — I5022 Chronic systolic (congestive) heart failure: Secondary | ICD-10-CM | POA: Diagnosis not present

## 2024-03-07 DIAGNOSIS — I428 Other cardiomyopathies: Secondary | ICD-10-CM | POA: Diagnosis not present

## 2024-03-15 DIAGNOSIS — Z125 Encounter for screening for malignant neoplasm of prostate: Secondary | ICD-10-CM | POA: Diagnosis not present

## 2024-03-15 DIAGNOSIS — Z0000 Encounter for general adult medical examination without abnormal findings: Secondary | ICD-10-CM | POA: Diagnosis not present

## 2024-03-15 DIAGNOSIS — I10 Essential (primary) hypertension: Secondary | ICD-10-CM | POA: Diagnosis not present

## 2024-03-22 DIAGNOSIS — G4733 Obstructive sleep apnea (adult) (pediatric): Secondary | ICD-10-CM | POA: Diagnosis not present

## 2024-03-22 DIAGNOSIS — E1121 Type 2 diabetes mellitus with diabetic nephropathy: Secondary | ICD-10-CM | POA: Diagnosis not present

## 2024-03-22 DIAGNOSIS — F411 Generalized anxiety disorder: Secondary | ICD-10-CM | POA: Diagnosis not present

## 2024-03-22 DIAGNOSIS — Z85528 Personal history of other malignant neoplasm of kidney: Secondary | ICD-10-CM | POA: Diagnosis not present

## 2024-03-22 DIAGNOSIS — Z0000 Encounter for general adult medical examination without abnormal findings: Secondary | ICD-10-CM | POA: Diagnosis not present

## 2024-03-22 DIAGNOSIS — I10 Essential (primary) hypertension: Secondary | ICD-10-CM | POA: Diagnosis not present

## 2024-03-22 DIAGNOSIS — I428 Other cardiomyopathies: Secondary | ICD-10-CM | POA: Diagnosis not present

## 2024-03-22 DIAGNOSIS — G2581 Restless legs syndrome: Secondary | ICD-10-CM | POA: Diagnosis not present

## 2024-03-22 DIAGNOSIS — Z95810 Presence of automatic (implantable) cardiac defibrillator: Secondary | ICD-10-CM | POA: Diagnosis not present

## 2024-03-22 DIAGNOSIS — Z905 Acquired absence of kidney: Secondary | ICD-10-CM | POA: Diagnosis not present

## 2024-03-22 DIAGNOSIS — N1831 Chronic kidney disease, stage 3a: Secondary | ICD-10-CM | POA: Diagnosis not present

## 2024-03-22 DIAGNOSIS — I5022 Chronic systolic (congestive) heart failure: Secondary | ICD-10-CM | POA: Diagnosis not present

## 2024-04-18 DIAGNOSIS — G4733 Obstructive sleep apnea (adult) (pediatric): Secondary | ICD-10-CM | POA: Diagnosis not present

## 2024-04-18 DIAGNOSIS — G4761 Periodic limb movement disorder: Secondary | ICD-10-CM | POA: Diagnosis not present

## 2024-04-18 DIAGNOSIS — R634 Abnormal weight loss: Secondary | ICD-10-CM | POA: Diagnosis not present

## 2024-04-18 DIAGNOSIS — Z95810 Presence of automatic (implantable) cardiac defibrillator: Secondary | ICD-10-CM | POA: Diagnosis not present

## 2024-04-27 ENCOUNTER — Other Ambulatory Visit (HOSPITAL_COMMUNITY): Payer: Self-pay

## 2024-04-27 MED ORDER — CITALOPRAM HYDROBROMIDE 20 MG PO TABS
30.0000 mg | ORAL_TABLET | Freq: Every day | ORAL | 3 refills | Status: AC
  Filled 2024-06-03: qty 135, 90d supply, fill #0

## 2024-04-27 MED ORDER — CITALOPRAM HYDROBROMIDE 20 MG PO TABS
30.0000 mg | ORAL_TABLET | Freq: Every day | ORAL | 0 refills | Status: AC
  Filled 2024-04-27: qty 135, 90d supply, fill #0

## 2024-04-28 ENCOUNTER — Other Ambulatory Visit (HOSPITAL_COMMUNITY): Payer: Self-pay

## 2024-04-28 MED ORDER — CARVEDILOL 12.5 MG PO TABS
12.5000 mg | ORAL_TABLET | Freq: Two times a day (BID) | ORAL | 3 refills | Status: AC
  Filled 2024-04-28 – 2024-06-03 (×2): qty 180, 90d supply, fill #0
  Filled 2024-09-01: qty 180, 90d supply, fill #1

## 2024-04-28 MED ORDER — SACUBITRIL-VALSARTAN 24-26 MG PO TABS
1.0000 | ORAL_TABLET | Freq: Two times a day (BID) | ORAL | 3 refills | Status: AC
  Filled 2024-04-28 – 2024-06-03 (×2): qty 180, 90d supply, fill #0
  Filled 2024-09-01: qty 180, 90d supply, fill #1

## 2024-04-28 MED ORDER — FUROSEMIDE 20 MG PO TABS
20.0000 mg | ORAL_TABLET | ORAL | 4 refills | Status: AC
  Filled 2024-04-28: qty 45, 90d supply, fill #0

## 2024-04-28 MED ORDER — TAMSULOSIN HCL 0.4 MG PO CAPS
0.4000 mg | ORAL_CAPSULE | Freq: Every day | ORAL | 0 refills | Status: AC
  Filled 2024-04-28: qty 90, 90d supply, fill #0

## 2024-05-02 DIAGNOSIS — C649 Malignant neoplasm of unspecified kidney, except renal pelvis: Secondary | ICD-10-CM | POA: Diagnosis not present

## 2024-05-02 DIAGNOSIS — R0602 Shortness of breath: Secondary | ICD-10-CM | POA: Diagnosis not present

## 2024-05-02 DIAGNOSIS — E785 Hyperlipidemia, unspecified: Secondary | ICD-10-CM | POA: Diagnosis not present

## 2024-05-02 DIAGNOSIS — I493 Ventricular premature depolarization: Secondary | ICD-10-CM | POA: Diagnosis not present

## 2024-05-02 DIAGNOSIS — G4733 Obstructive sleep apnea (adult) (pediatric): Secondary | ICD-10-CM | POA: Diagnosis not present

## 2024-05-02 DIAGNOSIS — I428 Other cardiomyopathies: Secondary | ICD-10-CM | POA: Diagnosis not present

## 2024-05-02 DIAGNOSIS — E782 Mixed hyperlipidemia: Secondary | ICD-10-CM | POA: Diagnosis not present

## 2024-05-02 DIAGNOSIS — Z95810 Presence of automatic (implantable) cardiac defibrillator: Secondary | ICD-10-CM | POA: Diagnosis not present

## 2024-05-02 DIAGNOSIS — I10 Essential (primary) hypertension: Secondary | ICD-10-CM | POA: Diagnosis not present

## 2024-05-02 DIAGNOSIS — Z4502 Encounter for adjustment and management of automatic implantable cardiac defibrillator: Secondary | ICD-10-CM | POA: Diagnosis not present

## 2024-05-02 DIAGNOSIS — I5022 Chronic systolic (congestive) heart failure: Secondary | ICD-10-CM | POA: Diagnosis not present

## 2024-05-18 ENCOUNTER — Other Ambulatory Visit (HOSPITAL_COMMUNITY): Payer: Self-pay

## 2024-05-18 ENCOUNTER — Other Ambulatory Visit: Payer: Self-pay

## 2024-05-18 MED ORDER — SYNJARDY XR 12.5-1000 MG PO TB24
1.0000 | ORAL_TABLET | Freq: Two times a day (BID) | ORAL | 11 refills | Status: DC
  Filled 2024-05-18: qty 60, 30d supply, fill #0

## 2024-05-19 ENCOUNTER — Other Ambulatory Visit (HOSPITAL_COMMUNITY): Payer: Self-pay

## 2024-06-03 ENCOUNTER — Other Ambulatory Visit (HOSPITAL_COMMUNITY): Payer: Self-pay

## 2024-06-03 DIAGNOSIS — H5203 Hypermetropia, bilateral: Secondary | ICD-10-CM | POA: Diagnosis not present

## 2024-06-03 DIAGNOSIS — H52223 Regular astigmatism, bilateral: Secondary | ICD-10-CM | POA: Diagnosis not present

## 2024-06-03 DIAGNOSIS — I10 Essential (primary) hypertension: Secondary | ICD-10-CM | POA: Diagnosis not present

## 2024-06-03 DIAGNOSIS — H25813 Combined forms of age-related cataract, bilateral: Secondary | ICD-10-CM | POA: Diagnosis not present

## 2024-06-03 DIAGNOSIS — H524 Presbyopia: Secondary | ICD-10-CM | POA: Diagnosis not present

## 2024-06-03 DIAGNOSIS — Z794 Long term (current) use of insulin: Secondary | ICD-10-CM | POA: Diagnosis not present

## 2024-06-03 DIAGNOSIS — E119 Type 2 diabetes mellitus without complications: Secondary | ICD-10-CM | POA: Diagnosis not present

## 2024-06-04 ENCOUNTER — Other Ambulatory Visit (HOSPITAL_COMMUNITY): Payer: Self-pay

## 2024-06-17 ENCOUNTER — Other Ambulatory Visit (HOSPITAL_COMMUNITY): Payer: Self-pay

## 2024-06-17 MED ORDER — SYNJARDY XR 12.5-1000 MG PO TB24
1.0000 | ORAL_TABLET | Freq: Two times a day (BID) | ORAL | 11 refills | Status: AC
  Filled 2024-06-17: qty 60, 30d supply, fill #0
  Filled 2024-07-05: qty 60, 30d supply, fill #1

## 2024-06-20 ENCOUNTER — Other Ambulatory Visit: Payer: Self-pay

## 2024-06-21 ENCOUNTER — Other Ambulatory Visit (HOSPITAL_COMMUNITY): Payer: Self-pay

## 2024-06-21 ENCOUNTER — Other Ambulatory Visit: Payer: Self-pay

## 2024-07-05 ENCOUNTER — Other Ambulatory Visit (HOSPITAL_COMMUNITY): Payer: Self-pay

## 2024-07-05 ENCOUNTER — Other Ambulatory Visit: Payer: Self-pay

## 2024-07-05 DIAGNOSIS — I10 Essential (primary) hypertension: Secondary | ICD-10-CM | POA: Diagnosis not present

## 2024-07-05 DIAGNOSIS — Z23 Encounter for immunization: Secondary | ICD-10-CM | POA: Diagnosis not present

## 2024-07-05 DIAGNOSIS — E1121 Type 2 diabetes mellitus with diabetic nephropathy: Secondary | ICD-10-CM | POA: Diagnosis not present

## 2024-07-05 DIAGNOSIS — E785 Hyperlipidemia, unspecified: Secondary | ICD-10-CM | POA: Diagnosis not present

## 2024-07-05 DIAGNOSIS — N401 Enlarged prostate with lower urinary tract symptoms: Secondary | ICD-10-CM | POA: Diagnosis not present

## 2024-07-05 DIAGNOSIS — F411 Generalized anxiety disorder: Secondary | ICD-10-CM | POA: Diagnosis not present

## 2024-07-05 DIAGNOSIS — I428 Other cardiomyopathies: Secondary | ICD-10-CM | POA: Diagnosis not present

## 2024-07-05 MED ORDER — TAMSULOSIN HCL 0.4 MG PO CAPS
0.4000 mg | ORAL_CAPSULE | Freq: Every day | ORAL | 3 refills | Status: AC
  Filled 2024-07-05: qty 90, 90d supply, fill #0

## 2024-07-05 MED ORDER — CITALOPRAM HYDROBROMIDE 20 MG PO TABS
30.0000 mg | ORAL_TABLET | Freq: Every day | ORAL | 3 refills | Status: AC
  Filled 2024-07-05 – 2024-09-01 (×2): qty 135, 90d supply, fill #0

## 2024-07-05 MED ORDER — MOUNJARO 15 MG/0.5ML ~~LOC~~ SOAJ
15.0000 mg | SUBCUTANEOUS | 3 refills | Status: AC
  Filled 2024-07-05: qty 6, 84d supply, fill #0

## 2024-07-05 MED ORDER — SACUBITRIL-VALSARTAN 24-26 MG PO TABS: 1.0000 | ORAL_TABLET | Freq: Two times a day (BID) | ORAL | 3 refills | Status: AC

## 2024-07-05 MED ORDER — FUROSEMIDE 20 MG PO TABS
20.0000 mg | ORAL_TABLET | ORAL | 3 refills | Status: AC
  Filled 2024-07-05: qty 45, 90d supply, fill #0

## 2024-07-05 MED ORDER — ROSUVASTATIN CALCIUM 20 MG PO TABS
20.0000 mg | ORAL_TABLET | Freq: Every day | ORAL | 3 refills | Status: AC
  Filled 2024-07-05: qty 90, 90d supply, fill #0

## 2024-07-05 MED ORDER — SYNJARDY XR 12.5-1000 MG PO TB24
1.0000 | ORAL_TABLET | Freq: Two times a day (BID) | ORAL | 3 refills | Status: AC
  Filled 2024-07-05 – 2024-07-11 (×2): qty 180, 90d supply, fill #0

## 2024-07-05 MED ORDER — CARVEDILOL 12.5 MG PO TABS
12.5000 mg | ORAL_TABLET | Freq: Two times a day (BID) | ORAL | 3 refills | Status: AC
  Filled 2024-07-05: qty 180, 90d supply, fill #0

## 2024-07-11 ENCOUNTER — Other Ambulatory Visit: Payer: Self-pay

## 2024-07-11 ENCOUNTER — Other Ambulatory Visit (HOSPITAL_COMMUNITY): Payer: Self-pay

## 2024-07-12 DIAGNOSIS — H25813 Combined forms of age-related cataract, bilateral: Secondary | ICD-10-CM | POA: Diagnosis not present

## 2024-07-20 DIAGNOSIS — H2511 Age-related nuclear cataract, right eye: Secondary | ICD-10-CM | POA: Diagnosis not present

## 2024-08-09 ENCOUNTER — Other Ambulatory Visit (HOSPITAL_BASED_OUTPATIENT_CLINIC_OR_DEPARTMENT_OTHER): Payer: Self-pay

## 2024-08-22 ENCOUNTER — Other Ambulatory Visit (HOSPITAL_BASED_OUTPATIENT_CLINIC_OR_DEPARTMENT_OTHER): Payer: Self-pay

## 2024-08-22 MED ORDER — FUROSEMIDE 20 MG PO TABS: 20.0000 mg | ORAL_TABLET | ORAL | 3 refills | Status: AC

## 2024-08-22 MED ORDER — CARVEDILOL 12.5 MG PO TABS: 12.5000 mg | ORAL_TABLET | Freq: Two times a day (BID) | ORAL | 3 refills | Status: AC

## 2024-08-22 MED ORDER — SACUBITRIL-VALSARTAN 24-26 MG PO TABS: 1.0000 | ORAL_TABLET | Freq: Two times a day (BID) | ORAL | 3 refills | Status: AC

## 2024-08-22 MED ORDER — ROSUVASTATIN CALCIUM 20 MG PO TABS: 20.0000 mg | ORAL_TABLET | Freq: Every day | ORAL | 3 refills | Status: AC

## 2024-09-01 ENCOUNTER — Other Ambulatory Visit (HOSPITAL_COMMUNITY): Payer: Self-pay
